# Patient Record
Sex: Male | Born: 1990
Health system: Southern US, Community
[De-identification: ages and names within clinical notes are randomized; demographics above are authoritative.]

## PROBLEM LIST (undated history)

## (undated) DIAGNOSIS — Z87828 Personal history of other (healed) physical injury and trauma: Secondary | ICD-10-CM

## (undated) DIAGNOSIS — J45909 Unspecified asthma, uncomplicated: Secondary | ICD-10-CM

## (undated) DIAGNOSIS — T7840XA Allergy, unspecified, initial encounter: Secondary | ICD-10-CM

## (undated) HISTORY — DX: Allergy, unspecified, initial encounter: T78.40XA

## (undated) HISTORY — DX: Unspecified asthma, uncomplicated: J45.909

## (undated) HISTORY — DX: Personal history of other (healed) physical injury and trauma: Z87.828

---
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
## 2015-12-10 ENCOUNTER — Encounter: Payer: Self-pay | Admitting: Sports Medicine

## 2015-12-10 DIAGNOSIS — M79671 Pain in right foot: Secondary | ICD-10-CM

## 2015-12-10 DIAGNOSIS — R609 Edema, unspecified: Secondary | ICD-10-CM

## 2015-12-10 DIAGNOSIS — S8391XA Sprain of unspecified site of right knee, initial encounter: Secondary | ICD-10-CM

## 2015-12-10 NOTE — Patient Instructions (Signed)

## 2015-12-10 NOTE — Progress Notes (Signed)
Subjective: Ian Spencer is a 25 y.o. male patient who presents to office for evaluation of right foot and knee pain. Patient states that he took a mis-step and had pain in foot that's now resolved but continued pain the in the knee. Patient denies any other pedal complaints.  Admits to significant history of right knee torn meniscus, partial PCL tear and ACL repair.   There are no active problems to display for this patient.   No current outpatient prescriptions on file prior to visit.   No current facility-administered medications on file prior to visit.     Allergies not on file  Objective:  General: Alert and oriented x3 in no acute distress  Dermatology: No open lesions bilateral lower extremities, no webspace macerations, no ecchymosis bilateral, all nails x 10 are well manicured.  Vascular: Dorsalis Pedis and Posterior Tibial pedal pulses palpable, Capillary Fill Time 3 seconds,(+) pedal hair growth bilateral, focal edema right knee, femoral and popliteal artery intact, Temperature gradient within normal limits.  Neurology: Ian Spencer sensation intact via light touch bilateral, Protective sensation intact  with Semmes Weinstein Monofilament to all pedal sites, Position sense intact, vibratory intact bilateral, Deep tendon reflexes within normal limits bilateral, No babinski sign present bilateral. (- )Tinels sign bilateral.   Musculoskeletal: Significant tenderness to right knee, No tenderness with palpation at right foot, no ankle instability, No pain with calf compression bilateral. There is decreased ankle rom with knee extending  vs flexed resembling gastroc equnius bilateral, Subtalar joint range of motion is within normal limits, there is no 1st ray hypermobility noted bilateral, decreased 1st MPJ rom Right>Left with functional limitus noted on weightbearing exam. Strength within normal limits in all groups bilateral.   Gait: Antalgic gait  Assessment and Plan: Problem List Items  Addressed This Visit    None    Visit Diagnoses    Knee sprain and strain, right, initial encounter    -  Primary   Swelling       Foot pain, right       Resolved      -Complete examination performed -Recommend good supportive shoes for stability in gait and use of right knee brace, rest, ice twice daily, elevation -Recommend Motrin 3-4x daily -Recommend no work to rest knee for 3-4 weeks until symptoms are resolved if fail to improve would recommend Ortho follow up -Patient to return to office as needed or sooner if condition worsens.  Ian Spencer, DPM

## 2016-01-03 ENCOUNTER — Encounter: Payer: Self-pay | Admitting: Sports Medicine

## 2016-01-03 NOTE — Progress Notes (Signed)
Subjective: Ian Spencer is a 25 y.o. male patient who returns to office for follow up evaluation of right foot and knee pain. Patient has been out of work since 12/10/15 after he took a  mis-step and had pain in foot that's now resolved but continued pain the in the knee. Patient reports rest and bracing has helped. But still has swelling at knee. Patient denies any other pedal complaints.  There are no active problems to display for this patient.   No current outpatient prescriptions on file prior to visit.   No current facility-administered medications on file prior to visit.     Not on File  Objective:  General: Alert and oriented x3 in no acute distress  Dermatology: No open lesions bilateral lower extremities, no webspace macerations, no ecchymosis bilateral, all nails x 10 are well manicured.  Vascular: Dorsalis Pedis and Posterior Tibial pedal pulses palpable, Capillary Fill Time 3 seconds,(+) pedal hair growth bilateral, focal edema right knee, femoral and popliteal artery intact, Temperature gradient within normal limits.  Neurology: [name] sensation intact via light touch bilateral, Protective sensation intact  with Semmes Weinstein Monofilament to all pedal sites, Position sense intact, vibratory intact bilateral, Deep tendon reflexes within normal limits bilateral, No babinski sign present bilateral. (- )Tinels sign bilateral.   Musculoskeletal: Moderate edema with decreased tenderness to right knee, No tenderness with palpation at right foot, no ankle instability, No pain with calf compression bilateral. There is decreased ankle rom with knee extending  vs flexed resembling gastroc equnius bilateral, Subtalar joint range of motion is within normal limits, there is no 1st ray hypermobility noted bilateral, decreased 1st MPJ rom Right>Left with functional limitus noted on weightbearing exam. Strength within normal limits in all groups bilateral.   Gait: Mildly Antalgic  gait  Assessment and Plan: Right knee sprain Right knee swelling History of previous Right knee injury Right foot pain, resolved  -Complete examination performed -Recommend good supportive shoes for stability in gait and use of right knee brace, rest, ice twice daily, and elevation -Changed motrin to meloxicam to assist with decreasing edema at right knee -Recommend no work to rest knee and to prevent medication side effects of drowsiness that may interfere with duties for 3 weeks (recommend patient to return to work on 01-28-16 with no restrictions).  If knee fails to continue improve would recommend Ortho follow up -Patient to return to office as needed or sooner if condition worsens.  Asencion Islamitorya Daryn Pisani, DPM

## 2016-02-01 ENCOUNTER — Encounter: Payer: Self-pay | Admitting: Sports Medicine

## 2016-02-01 NOTE — Progress Notes (Signed)
Patient presented to office for disability paperwork. Return to duty form completed for 02-11-16 with no restrictions.  -Dr. Marylene LandStover

## 2019-02-16 DIAGNOSIS — L700 Acne vulgaris: Secondary | ICD-10-CM | POA: Diagnosis not present

## 2019-02-16 DIAGNOSIS — L858 Other specified epidermal thickening: Secondary | ICD-10-CM | POA: Diagnosis not present

## 2019-02-16 DIAGNOSIS — L218 Other seborrheic dermatitis: Secondary | ICD-10-CM | POA: Diagnosis not present

## 2019-03-29 DIAGNOSIS — M25561 Pain in right knee: Secondary | ICD-10-CM | POA: Diagnosis not present

## 2019-04-01 DIAGNOSIS — Z136 Encounter for screening for cardiovascular disorders: Secondary | ICD-10-CM | POA: Diagnosis not present

## 2019-04-01 DIAGNOSIS — Z713 Dietary counseling and surveillance: Secondary | ICD-10-CM | POA: Diagnosis not present

## 2019-04-01 DIAGNOSIS — Z13220 Encounter for screening for lipoid disorders: Secondary | ICD-10-CM | POA: Diagnosis not present

## 2019-04-01 DIAGNOSIS — Z6825 Body mass index (BMI) 25.0-25.9, adult: Secondary | ICD-10-CM | POA: Diagnosis not present

## 2019-04-05 DIAGNOSIS — Z20828 Contact with and (suspected) exposure to other viral communicable diseases: Secondary | ICD-10-CM | POA: Diagnosis not present

## 2019-04-11 DIAGNOSIS — M25561 Pain in right knee: Secondary | ICD-10-CM | POA: Diagnosis not present

## 2019-05-03 DIAGNOSIS — Z20828 Contact with and (suspected) exposure to other viral communicable diseases: Secondary | ICD-10-CM | POA: Diagnosis not present

## 2019-09-01 DIAGNOSIS — Z03818 Encounter for observation for suspected exposure to other biological agents ruled out: Secondary | ICD-10-CM | POA: Diagnosis not present

## 2019-10-17 DIAGNOSIS — L700 Acne vulgaris: Secondary | ICD-10-CM | POA: Diagnosis not present

## 2019-10-17 DIAGNOSIS — B360 Pityriasis versicolor: Secondary | ICD-10-CM | POA: Diagnosis not present

## 2019-10-17 DIAGNOSIS — L2084 Intrinsic (allergic) eczema: Secondary | ICD-10-CM | POA: Diagnosis not present

## 2019-12-08 DIAGNOSIS — Z20822 Contact with and (suspected) exposure to covid-19: Secondary | ICD-10-CM | POA: Diagnosis not present

## 2019-12-26 DIAGNOSIS — Z20822 Contact with and (suspected) exposure to covid-19: Secondary | ICD-10-CM | POA: Diagnosis not present

## 2020-01-09 DIAGNOSIS — Z20822 Contact with and (suspected) exposure to covid-19: Secondary | ICD-10-CM | POA: Diagnosis not present

## 2020-02-14 ENCOUNTER — Other Ambulatory Visit: Payer: Self-pay

## 2020-02-14 DIAGNOSIS — F411 Generalized anxiety disorder: Secondary | ICD-10-CM | POA: Diagnosis not present

## 2020-02-14 DIAGNOSIS — F321 Major depressive disorder, single episode, moderate: Secondary | ICD-10-CM | POA: Diagnosis not present

## 2020-02-15 ENCOUNTER — Other Ambulatory Visit: Payer: Self-pay

## 2020-02-15 ENCOUNTER — Ambulatory Visit (INDEPENDENT_AMBULATORY_CARE_PROVIDER_SITE_OTHER): Payer: BC Managed Care – PPO | Admitting: Nurse Practitioner

## 2020-02-15 ENCOUNTER — Encounter: Payer: Self-pay | Admitting: Nurse Practitioner

## 2020-02-15 VITALS — BP 110/74 | HR 70 | Temp 97.7°F | Ht 72.25 in | Wt 203.6 lb

## 2020-02-15 DIAGNOSIS — F329 Major depressive disorder, single episode, unspecified: Secondary | ICD-10-CM

## 2020-02-15 DIAGNOSIS — F339 Major depressive disorder, recurrent, unspecified: Secondary | ICD-10-CM | POA: Insufficient documentation

## 2020-02-15 DIAGNOSIS — F32A Depression, unspecified: Secondary | ICD-10-CM

## 2020-02-15 DIAGNOSIS — Z13220 Encounter for screening for lipoid disorders: Secondary | ICD-10-CM

## 2020-02-15 DIAGNOSIS — Z136 Encounter for screening for cardiovascular disorders: Secondary | ICD-10-CM | POA: Diagnosis not present

## 2020-02-15 DIAGNOSIS — F411 Generalized anxiety disorder: Secondary | ICD-10-CM | POA: Insufficient documentation

## 2020-02-15 DIAGNOSIS — Z0000 Encounter for general adult medical examination without abnormal findings: Secondary | ICD-10-CM | POA: Diagnosis not present

## 2020-02-15 NOTE — Progress Notes (Signed)
Subjective:    Patient ID: Ian Spencer, male    DOB: 07/21/1990, 29 y.o.   MRN: [id number]  Patient presents today for CPE and eval of chronic condition (anxiety and depression  HPI Depressive disorder Chronic, worse in last 51months. Unable to identify triggering factor. reports thoughts of death but no plans, no hallucination, no hx of suicide attempt. No known FHx of mental health disorder, no FHx of suicide. No ETOH use, no tobacco use, no illicit use. occassional caffeine intake. Start therapy sessions 14month ago with minimal improvement. Also evaluated by behavioral health provider. These services are through [facility]. He is requesting referral to psychiatry. He was STD forms completed. He has put therapy sessions on hold till STD forms are completed.  I advised to resumed therapy sessions, but he declined. I provided a list of psychiatrist in the area. He is to verify which is within his network and call office for referral.  Sexual History (orientation,birth control, marital status, STD):married, sexually active, denies need for STD screen  Depression/Suicide:has ongoing sessions with therapy, wants referral to psychiatry, reports thoughts of death but no plans, no hallucination, no hx of suicide attempt. Depression screen PHQ 2/9 02/15/2020  Decreased Interest 3  Down, Depressed, Hopeless 3  PHQ - 2 Score 6  Altered sleeping 3  Tired, decreased energy 3  Change in appetite 3  Trouble concentrating 3  Moving slowly or fidgety/restless 3  Suicidal thoughts 2  PHQ-9 Score 23  Difficult doing work/chores Extremely dIfficult   GAD 7 : Generalized Anxiety Score 02/15/2020  Nervous, Anxious, on Edge 3  Control/stop worrying 3  Worry too much - different things 3  Trouble relaxing 3  Restless 3  Easily annoyed or irritable 3  Afraid - awful might happen 3  Total GAD 7 Score 21  Anxiety Difficulty Extremely difficult   Vision:will schedule  Dental:will  schedule  Immunizations: (TDAP, Hep C screen, Pneumovax, Influenza, zoster)  Health Maintenance  Topic Date Due  . Flu Shot  07/26/2020*  . Tetanus Vaccine  02/14/2021*  .  Hepatitis C: One time screening is recommended by Center for Disease Control  (CDC) for  adults born from [birth year] through 1965.   02/14/2021*  . HIV Screening  02/14/2021*  *Topic was postponed. The date shown is not the original due date.   Diet:regular.  Weight:  Wt Readings from Last 3 Encounters:  02/15/20 203 lb 9.6 oz (92.4 kg)   Medications and allergies reviewed with patient and updated if appropriate.  Patient Active Problem List   Diagnosis Date Noted  . Depressive disorder 02/15/2020    No current outpatient medications on file prior to visit.   No current facility-administered medications on file prior to visit.    Past Medical History:  Diagnosis Date  . Allergy   . Asthma   . H/O sprain of knee     Past Surgical History:  Procedure Laterality Date  . KNEE ARTHROSCOPY WITH ANTERIOR CRUCIATE LIGAMENT (ACL) REPAIR Right 2010    Social History   Socioeconomic History  . Marital status: Married    Spouse name: Not on file  . Number of children: Not on file  . Years of education: Not on file  . Highest education level: Not on file  Occupational History  . Not on file  Tobacco Use  . Smoking status: Never Smoker  . Smokeless tobacco: Never Used  Vaping Use  . Vaping Use: Never used  Substance and Sexual Activity  .  Alcohol use: Yes    Comment: 3x year  . Drug use: Never  . Sexual activity: Yes    Birth control/protection: None  Other Topics Concern  . Not on file  Social History Narrative  . Not on file   Social Determinants of Health   Financial Resource Strain:   . Difficulty of Paying Living Expenses: Not on file  Food Insecurity:   . Worried About Programme researcher, broadcasting/film/video in the Last Year: Not on file  . Ran Out of Food in the Last Year: Not on file  Transportation  Needs:   . Lack of Transportation (Medical): Not on file  . Lack of Transportation (Non-Medical): Not on file  Physical Activity:   . Days of Exercise per Week: Not on file  . Minutes of Exercise per Session: Not on file  Stress:   . Feeling of Stress : Not on file  Social Connections:   . Frequency of Communication with Friends and Family: Not on file  . Frequency of Social Gatherings with Friends and Family: Not on file  . Attends Religious Services: Not on file  . Active Member of Clubs or Organizations: Not on file  . Attends Banker Meetings: Not on file  . Marital Status: Not on file    Family History  Problem Relation Age of Onset  . Cancer Maternal Grandfather 60       colon cancer        ROS  Objective:   Vitals:   02/15/20 1142  BP: 110/74  Pulse: 70  Temp: 97.7 F (36.5 C)  SpO2: 99%    Body mass index is 27.42 kg/m.   Physical Examination:  Physical Exam Vitals and nursing note reviewed. Exam conducted with a chaperone present.  Constitutional:      General: He is not in acute distress.    Appearance: He is well-developed.  HENT:     Right Ear: Tympanic membrane, ear canal and external ear normal.     Left Ear: Tympanic membrane, ear canal and external ear normal.  Eyes:     Extraocular Movements: Extraocular movements intact.     Conjunctiva/sclera: Conjunctivae normal.  Cardiovascular:     Rate and Rhythm: Normal rate and regular rhythm.     Pulses: Normal pulses.     Heart sounds: Normal heart sounds.  Pulmonary:     Effort: Pulmonary effort is normal. No respiratory distress.     Breath sounds: Normal breath sounds.  Chest:     Chest wall: No tenderness.  Abdominal:     General: Bowel sounds are normal.     Palpations: Abdomen is soft.  Genitourinary:    Testes: Normal.  Musculoskeletal:        General: Normal range of motion.     Cervical back: Normal range of motion and neck supple.     Right lower leg: No edema.      Left lower leg: No edema.  Skin:    General: Skin is warm and dry.  Neurological:     Mental Status: He is alert and oriented to person, place, and time.     Deep Tendon Reflexes: Reflexes are normal and symmetric.  Psychiatric:        Mood and Affect: Mood normal.        Behavior: Behavior normal.        Thought Content: Thought content normal.    ASSESSMENT and PLAN: This visit occurred during the SARS-CoV-2 public health  emergency.  Safety protocols were in place, including screening questions prior to the visit, additional usage of staff PPE, and extensive cleaning of exam room while observing appropriate contact time as indicated for disinfecting solutions.   Chibueze was seen today for establish care.  Diagnoses and all orders for this visit:  Preventative health care -     CBC with Differential/Platelet; Future -     Comprehensive metabolic panel; Future -     Lipid panel; Future -     TSH; Future  Encounter for lipid screening for cardiovascular disease -     Lipid panel; Future  Depressive disorder      Problem List Items Addressed This Visit      Other   Depressive disorder    Chronic, worse in last 67months. Unable to identify triggering factor. reports thoughts of death but no plans, no hallucination, no hx of suicide attempt. No known FHx of mental health disorder, no FHx of suicide. No ETOH use, no tobacco use, no illicit use. occassional caffeine intake. Start therapy sessions 69month ago with minimal improvement. Also evaluated by behavioral health provider. These services are through [facility]. He is requesting referral to psychiatry. He was STD forms completed. He has put therapy sessions on hold till STD forms are completed.  I advised to resumed therapy sessions, but he declined. I provided a list of psychiatrist in the area. He is to verify which is within his network and call office for referral.       Other Visit Diagnoses    Preventative health  care    -  Primary   Relevant Orders   CBC with Differential/Platelet   Comprehensive metabolic panel   Lipid panel   TSH   Encounter for lipid screening for cardiovascular disease       Relevant Orders   Lipid panel      Follow up: Return in about 1 year (around 02/14/2021) for CPE (fasting).  Alysia Penna, NP

## 2020-02-15 NOTE — Patient Instructions (Addendum)
[facility] [place] Let me know which of these are within your insurance network.  Schedule lab appt, need to be fasting at least 6-8hrs prior blood draw.   Preventive Care 34-29 Years Old, Male Preventive care refers to lifestyle choices and visits with your health care provider that can promote health and wellness. This includes:  A yearly physical exam. This is also called an annual well check.  Regular dental and eye exams.  Immunizations.  Screening for certain conditions.  Healthy lifestyle choices, such as eating a healthy diet, getting regular exercise, not using drugs or products that contain nicotine and tobacco, and limiting alcohol use. What can I expect for my preventive care visit? Physical exam Your health care provider will check:  Height and weight. These may be used to calculate body mass index (BMI), which is a measurement that tells if you are at a healthy weight.  Heart rate and blood pressure.  Your skin for abnormal spots. Counseling Your health care provider may ask you questions about:  Alcohol, tobacco, and drug use.  Emotional well-being.  Home and relationship well-being.  Sexual activity.  Eating habits.  Work and work Statistician. What immunizations do I need?  Influenza (flu) vaccine  This is recommended every year. Tetanus, diphtheria, and pertussis (Tdap) vaccine  You may need a Td booster every 10 years. Varicella (chickenpox) vaccine  You may need this vaccine if you have not already been vaccinated. Human papillomavirus (HPV) vaccine  If recommended by your health care provider, you may need three doses over 6 months. Measles, mumps, and rubella (MMR) vaccine  You may need at least one dose of MMR. You may also need a second dose. Meningococcal conjugate (MenACWY) vaccine  One dose is recommended if you are 29-45 years old and a [place] living in a residence hall, or if you have one of several medical conditions. You may also need additional booster doses. Pneumococcal conjugate (PCV13) vaccine  You may need this if you have certain conditions and were not previously vaccinated. Pneumococcal polysaccharide (PPSV23) vaccine  You may need one or two doses if you smoke cigarettes or if you have certain conditions. Hepatitis A vaccine  You may need this if you have certain conditions or if you travel or work in places where you may be exposed to hepatitis A. Hepatitis B vaccine  You may need this if you have certain conditions or if you travel or work in places where you may be exposed to hepatitis B. Haemophilus influenzae type b (Hib) vaccine  You may need this if you have certain risk factors. You may receive vaccines as individual doses or as more than one vaccine together in one shot (combination vaccines). Talk with your health care provider about the risks and benefits of combination vaccines. What tests do I need? Blood tests  Lipid and cholesterol levels. These may be checked every 5 years starting at age 25.  Hepatitis C test.  Hepatitis B test. Screening   Diabetes screening. This is done by checking your blood sugar (glucose) after you have not eaten for a while (fasting).  Sexually transmitted disease (STD) testing. Talk with your health care provider about your test results, treatment options, and if necessary, the need for more tests. Follow these instructions at home: Eating and drinking   Eat a diet that includes fresh fruits and vegetables, whole grains, lean protein, and low-fat dairy products.  Take vitamin and mineral  supplements as recommended by your health care provider.  Do not drink alcohol if your health care provider tells you not to drink.  If you drink alcohol: ? Limit how much you have to 0-2 drinks a day. ? Be aware of how much alcohol is in your drink. In the [place] one  drink equals one 12 oz bottle of beer (355 mL), one 5 oz glass of wine (148 mL), or one 1 oz glass of hard liquor (44 mL). Lifestyle  Take daily care of your teeth and gums.  Stay active. Exercise for at least 30 minutes on 5 or more days each week.  Do not use any products that contain nicotine or tobacco, such as cigarettes, e-cigarettes, and chewing tobacco. If you need help quitting, ask your health care provider.  If you are sexually active, practice safe sex. Use a condom or other form of protection to prevent STIs (sexually transmitted infections). What's next?  Go to your health care provider once a year for a well check visit.  Ask your health care provider how often you should have your eyes and teeth checked.  Stay up to date on all vaccines. This information is not intended to replace advice given to you by your health care provider. Make sure you discuss any questions you have with your health care provider. Document Revised: 04/08/2018 Document Reviewed: 04/08/2018 Elsevier Patient Education  2020 [employer].

## 2020-02-15 NOTE — Assessment & Plan Note (Addendum)
Chronic, worse in last 47months. Unable to identify triggering factor. reports thoughts of death but no plans, no hallucination, no hx of suicide attempt. No known FHx of mental health disorder, no FHx of suicide. No ETOH use, no tobacco use, no illicit use. occassional caffeine intake. Start therapy sessions 64month ago with minimal improvement. Also evaluated by behavioral health provider. These services are through [facility]. He is requesting referral to psychiatry. He was STD forms completed. He has put therapy sessions on hold till STD forms are completed.  I advised to resumed therapy sessions, but he declined. I provided a list of psychiatrist in the area. He is to verify which is within his network and call office for referral.

## 2020-02-16 ENCOUNTER — Other Ambulatory Visit (INDEPENDENT_AMBULATORY_CARE_PROVIDER_SITE_OTHER): Payer: BC Managed Care – PPO

## 2020-02-16 DIAGNOSIS — Z136 Encounter for screening for cardiovascular disorders: Secondary | ICD-10-CM

## 2020-02-16 DIAGNOSIS — Z13220 Encounter for screening for lipoid disorders: Secondary | ICD-10-CM | POA: Diagnosis not present

## 2020-02-16 DIAGNOSIS — Z0000 Encounter for general adult medical examination without abnormal findings: Secondary | ICD-10-CM | POA: Diagnosis not present

## 2020-02-16 LAB — CBC WITH DIFFERENTIAL/PLATELET
Basophils Absolute: 0 10*3/uL (ref 0.0–0.1)
Basophils Relative: 0.6 % (ref 0.0–3.0)
Eosinophils Absolute: 0.1 10*3/uL (ref 0.0–0.7)
Eosinophils Relative: 3.1 % (ref 0.0–5.0)
HCT: 43.6 % (ref 39.0–52.0)
Hemoglobin: 14.6 g/dL (ref 13.0–17.0)
Lymphocytes Relative: 39.6 % (ref 12.0–46.0)
Lymphs Abs: 1.8 10*3/uL (ref 0.7–4.0)
MCHC: 33.5 g/dL (ref 30.0–36.0)
MCV: 86.4 fl (ref 78.0–100.0)
Monocytes Absolute: 0.6 10*3/uL (ref 0.1–1.0)
Monocytes Relative: 12 % (ref 3.0–12.0)
Neutro Abs: 2.1 10*3/uL (ref 1.4–7.7)
Neutrophils Relative %: 44.7 % (ref 43.0–77.0)
Platelets: 164 10*3/uL (ref 150.0–400.0)
RBC: 5.05 Mil/uL (ref 4.22–5.81)
RDW: 13.2 % (ref 11.5–15.5)
WBC: 4.7 10*3/uL (ref 4.0–10.5)

## 2020-02-16 LAB — LIPID PANEL
Cholesterol: 171 mg/dL (ref 0–200)
HDL: 46.2 mg/dL (ref >39.00)
LDL Cholesterol: 110 mg/dL — ABNORMAL HIGH (ref 0–99)
NonHDL: 124.62
Total CHOL/HDL Ratio: 4
Triglycerides: 73 mg/dL (ref 0.0–149.0)
VLDL: 14.6 mg/dL (ref 0.0–40.0)

## 2020-02-16 LAB — TSH: TSH: 1.82 u[IU]/mL (ref 0.35–4.50)

## 2020-02-17 ENCOUNTER — Ambulatory Visit: Payer: Self-pay | Admitting: Physician Assistant

## 2020-02-17 DIAGNOSIS — Z713 Dietary counseling and surveillance: Secondary | ICD-10-CM | POA: Diagnosis not present

## 2020-02-17 DIAGNOSIS — Z13220 Encounter for screening for lipoid disorders: Secondary | ICD-10-CM | POA: Diagnosis not present

## 2020-02-17 DIAGNOSIS — Z6826 Body mass index (BMI) 26.0-26.9, adult: Secondary | ICD-10-CM | POA: Diagnosis not present

## 2020-02-17 DIAGNOSIS — Z136 Encounter for screening for cardiovascular disorders: Secondary | ICD-10-CM | POA: Diagnosis not present

## 2020-02-17 LAB — COMPREHENSIVE METABOLIC PANEL
ALT: 22 U/L (ref 0–53)
AST: 21 U/L (ref 0–37)
Albumin: 4.5 g/dL (ref 3.5–5.2)
Alkaline Phosphatase: 51 U/L (ref 39–117)
BUN: 14 mg/dL (ref 6–23)
CO2: 30 mEq/L (ref 19–32)
Calcium: 9.8 mg/dL (ref 8.4–10.5)
Chloride: 103 mEq/L (ref 96–112)
Creatinine, Ser: 1.19 mg/dL (ref 0.40–1.50)
GFR: 85 mL/min (ref >60.00)
Glucose, Bld: 96 mg/dL (ref 70–99)
Potassium: 4.3 mEq/L (ref 3.5–5.1)
Sodium: 138 mEq/L (ref 135–145)
Total Bilirubin: 1.8 mg/dL — ABNORMAL HIGH (ref 0.2–1.2)
Total Protein: 7.5 g/dL (ref 6.0–8.3)

## 2020-02-28 DIAGNOSIS — F321 Major depressive disorder, single episode, moderate: Secondary | ICD-10-CM | POA: Diagnosis not present

## 2020-02-28 DIAGNOSIS — F411 Generalized anxiety disorder: Secondary | ICD-10-CM | POA: Diagnosis not present

## 2020-03-06 DIAGNOSIS — F321 Major depressive disorder, single episode, moderate: Secondary | ICD-10-CM | POA: Diagnosis not present

## 2020-03-06 DIAGNOSIS — F411 Generalized anxiety disorder: Secondary | ICD-10-CM | POA: Diagnosis not present

## 2020-03-15 ENCOUNTER — Emergency Department (HOSPITAL_COMMUNITY): Payer: BC Managed Care – PPO

## 2020-03-15 ENCOUNTER — Other Ambulatory Visit: Payer: Self-pay

## 2020-03-15 ENCOUNTER — Emergency Department (HOSPITAL_COMMUNITY)
Admission: EM | Admit: 2020-03-15 | Discharge: 2020-03-15 | Disposition: A | Discharge status: Home / Self Care | Payer: BC Managed Care – PPO | Attending: Emergency Medicine | Admitting: Emergency Medicine

## 2020-03-15 ENCOUNTER — Encounter (HOSPITAL_COMMUNITY): Payer: Self-pay | Admitting: Emergency Medicine

## 2020-03-15 DIAGNOSIS — S3991XA Unspecified injury of abdomen, initial encounter: Secondary | ICD-10-CM | POA: Diagnosis not present

## 2020-03-15 DIAGNOSIS — Y92410 Unspecified street and highway as the place of occurrence of the external cause: Secondary | ICD-10-CM | POA: Diagnosis not present

## 2020-03-15 DIAGNOSIS — M545 Low back pain, unspecified: Secondary | ICD-10-CM | POA: Insufficient documentation

## 2020-03-15 DIAGNOSIS — S3992XA Unspecified injury of lower back, initial encounter: Secondary | ICD-10-CM | POA: Diagnosis not present

## 2020-03-15 DIAGNOSIS — R1084 Generalized abdominal pain: Secondary | ICD-10-CM

## 2020-03-15 DIAGNOSIS — M5117 Intervertebral disc disorders with radiculopathy, lumbosacral region: Secondary | ICD-10-CM | POA: Diagnosis not present

## 2020-03-15 DIAGNOSIS — R1030 Lower abdominal pain, unspecified: Secondary | ICD-10-CM | POA: Insufficient documentation

## 2020-03-15 DIAGNOSIS — S199XXA Unspecified injury of neck, initial encounter: Secondary | ICD-10-CM | POA: Diagnosis not present

## 2020-03-15 DIAGNOSIS — M4727 Other spondylosis with radiculopathy, lumbosacral region: Secondary | ICD-10-CM | POA: Diagnosis not present

## 2020-03-15 DIAGNOSIS — Y9389 Activity, other specified: Secondary | ICD-10-CM | POA: Insufficient documentation

## 2020-03-15 DIAGNOSIS — Z041 Encounter for examination and observation following transport accident: Secondary | ICD-10-CM | POA: Diagnosis not present

## 2020-03-15 DIAGNOSIS — M549 Dorsalgia, unspecified: Secondary | ICD-10-CM

## 2020-03-15 DIAGNOSIS — Q7649 Other congenital malformations of spine, not associated with scoliosis: Secondary | ICD-10-CM | POA: Diagnosis not present

## 2020-03-15 DIAGNOSIS — R202 Paresthesia of skin: Secondary | ICD-10-CM | POA: Insufficient documentation

## 2020-03-15 DIAGNOSIS — R200 Anesthesia of skin: Secondary | ICD-10-CM

## 2020-03-15 DIAGNOSIS — M542 Cervicalgia: Secondary | ICD-10-CM | POA: Diagnosis not present

## 2020-03-15 DIAGNOSIS — S299XXA Unspecified injury of thorax, initial encounter: Secondary | ICD-10-CM | POA: Diagnosis not present

## 2020-03-15 DIAGNOSIS — J45909 Unspecified asthma, uncomplicated: Secondary | ICD-10-CM | POA: Diagnosis not present

## 2020-03-15 DIAGNOSIS — M25462 Effusion, left knee: Secondary | ICD-10-CM | POA: Diagnosis not present

## 2020-03-15 LAB — CBC
HCT: 45.2 % (ref 39.0–52.0)
Hemoglobin: 14.9 g/dL (ref 13.0–17.0)
MCH: 28.6 pg (ref 26.0–34.0)
MCHC: 33 g/dL (ref 30.0–36.0)
MCV: 86.8 fL (ref 80.0–100.0)
Platelets: 189 10*3/uL (ref 150–400)
RBC: 5.21 MIL/uL (ref 4.22–5.81)
RDW: 12.6 % (ref 11.5–15.5)
WBC: 8.3 10*3/uL (ref 4.0–10.5)
nRBC: 0 % (ref 0.0–0.2)

## 2020-03-15 LAB — COMPREHENSIVE METABOLIC PANEL
ALT: 32 U/L (ref 0–44)
AST: 30 U/L (ref 15–41)
Albumin: 4.2 g/dL (ref 3.5–5.0)
Alkaline Phosphatase: 49 U/L (ref 38–126)
Anion gap: 9 (ref 5–15)
BUN: 14 mg/dL (ref 6–20)
CO2: 27 mmol/L (ref 22–32)
Calcium: 9.6 mg/dL (ref 8.9–10.3)
Chloride: 102 mmol/L (ref 98–111)
Creatinine, Ser: 1.33 mg/dL — ABNORMAL HIGH (ref 0.61–1.24)
GFR, Estimated: >60 mL/min (ref >60)
Glucose, Bld: 99 mg/dL (ref 70–99)
Potassium: 3.7 mmol/L (ref 3.5–5.1)
Sodium: 138 mmol/L (ref 135–145)
Total Bilirubin: 1.6 mg/dL — ABNORMAL HIGH (ref 0.3–1.2)
Total Protein: 7.9 g/dL (ref 6.5–8.1)

## 2020-03-15 LAB — LACTIC ACID, PLASMA: Lactic Acid, Venous: 1.3 mmol/L (ref 0.5–1.9)

## 2020-03-15 LAB — LIPASE, BLOOD: Lipase: 38 U/L (ref 11–51)

## 2020-03-15 IMAGING — MR MR THORACIC SPINE W/O CM
4 of 7 series · 17 of 48 positions shown · non-contrast
Comparison: Prior CT from earlier the same day.

CLINICAL DATA: Initial evaluation for acute trauma.

EXAM:
MRI THORACIC AND LUMBAR SPINE WITHOUT CONTRAST
TECHNIQUE: Multiplanar and multiecho pulse sequences of the thoracic and lumbar
spine were obtained without intravenous contrast.

[Series 2: T1 · sagittal · 3.0mm · 0.90mm/px · 3 of 16 slices shown (1 of 2)]
[im 1/16]
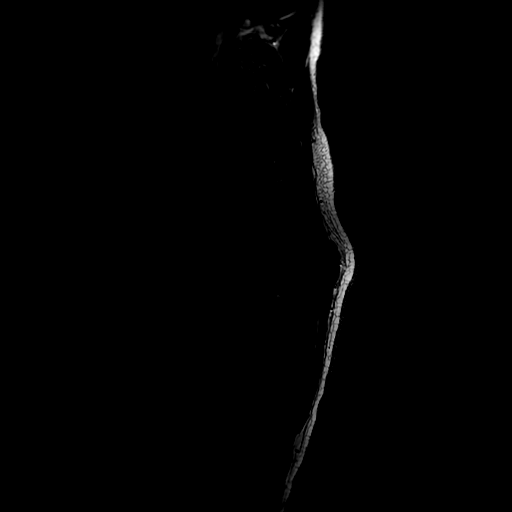
[im 8/16]
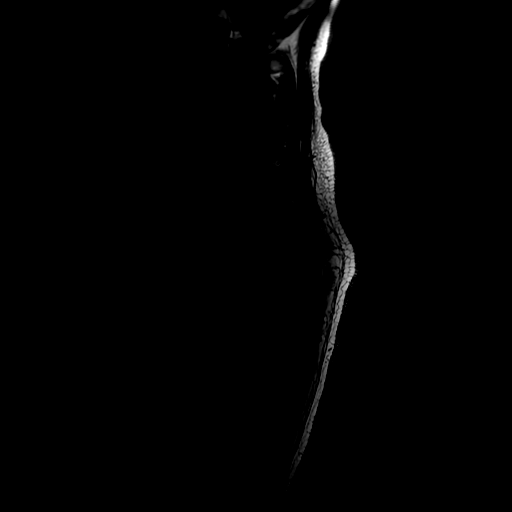
[im 16/16]
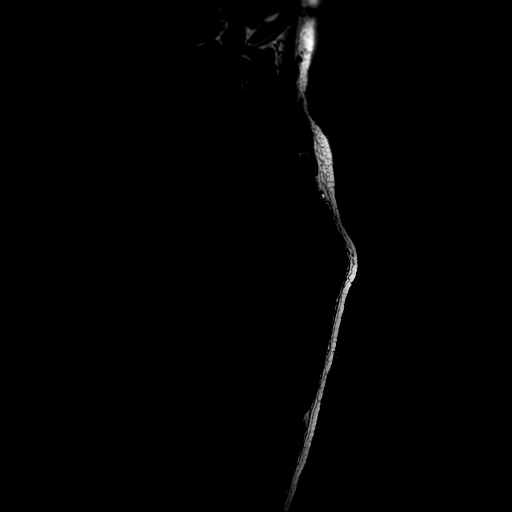

[Series 4: T2 · sagittal · 3.0mm · 0.66mm/px · 3 of 15 slices shown (1 of 2)]
[im 1/15]
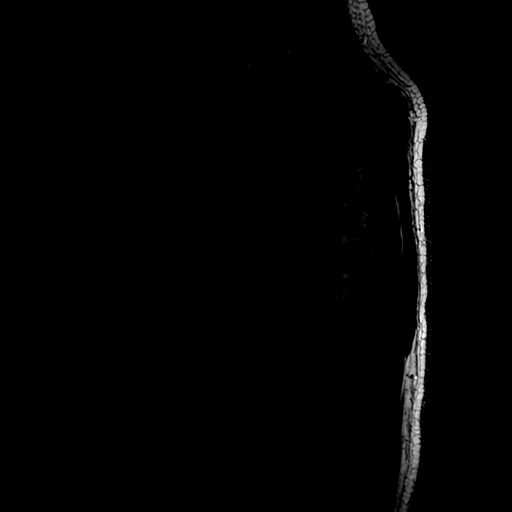
[im 8/15]
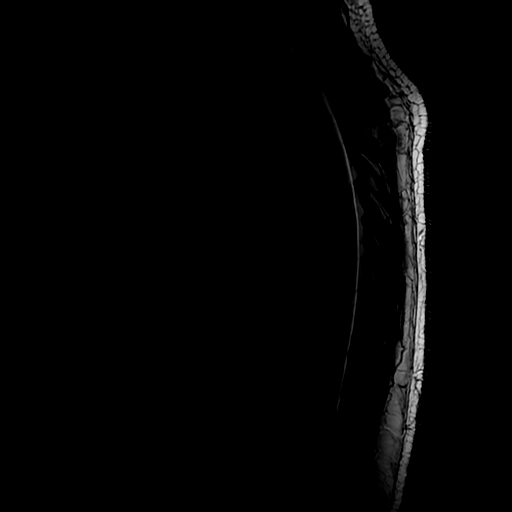
[im 15/15]
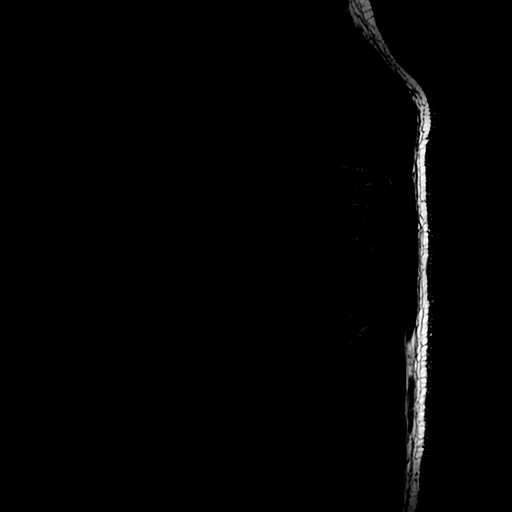

[Series 6: T1 · sagittal · 3.0mm · 0.66mm/px · 3 of 15 slices shown (2 of 2)]
[im 1/15]
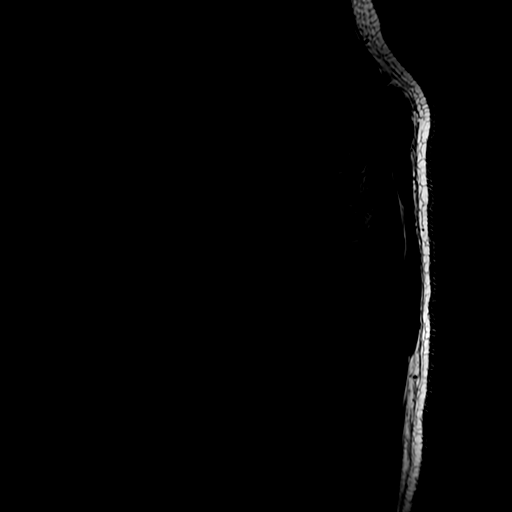
[im 8/15]
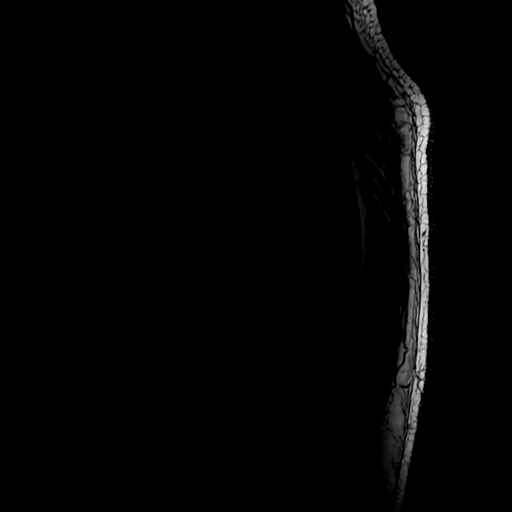
[im 15/15]
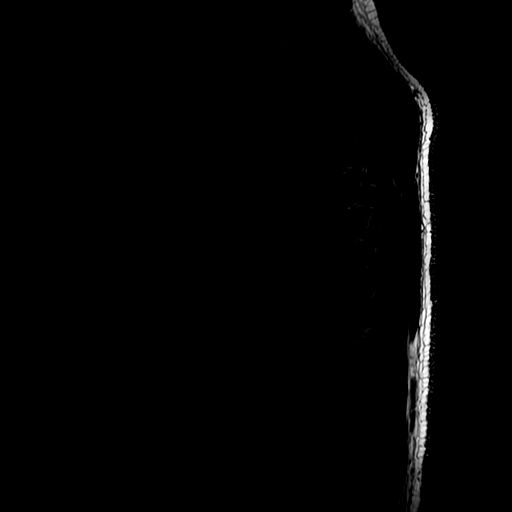

[Series 7: T2 · axial · 4.0mm · 0.39mm/px · z∈[-404,-134]mm · 8 of 55 slices shown (2 of 2)]
[im 1/55]
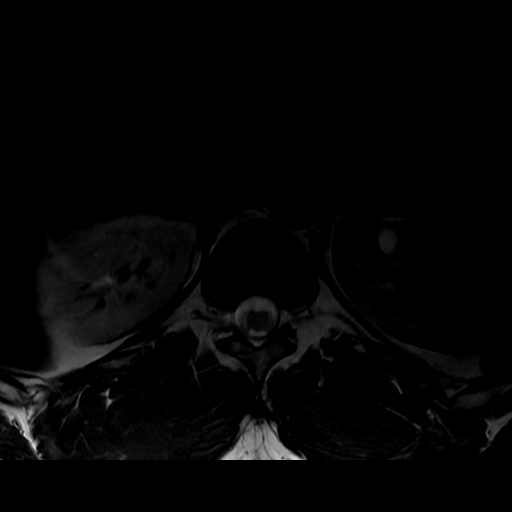
[im 10/55]
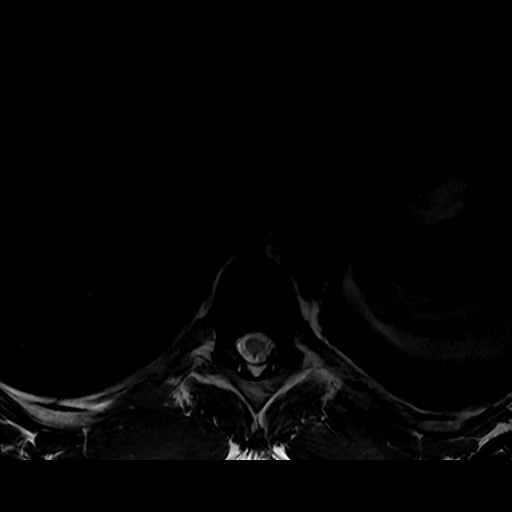
[im 15/55]
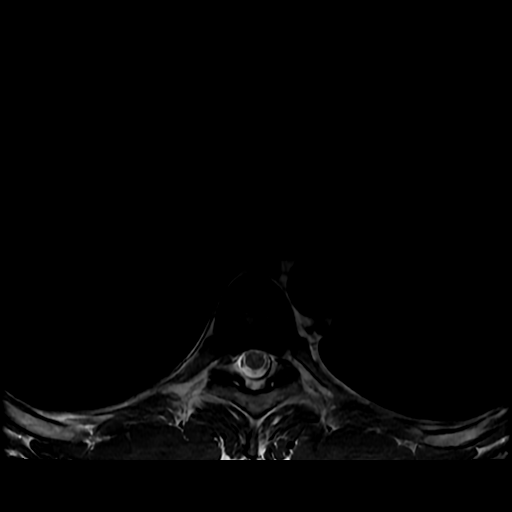
[im 25/55]
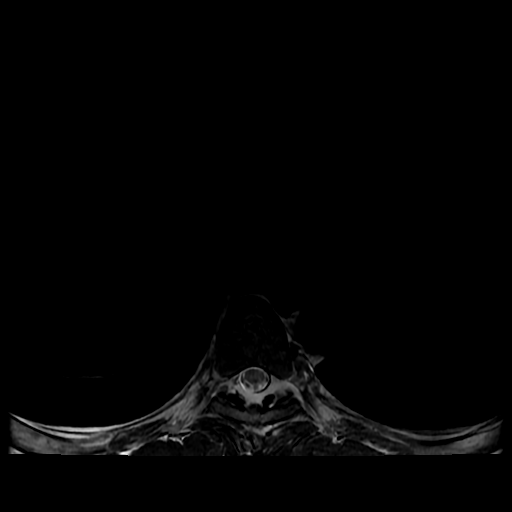
[im 30/55]
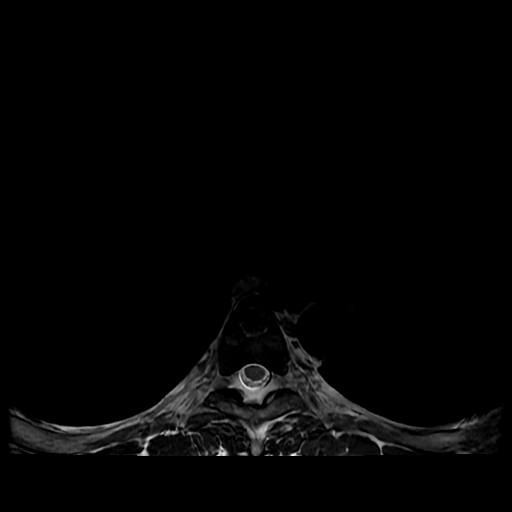
[im 40/55]
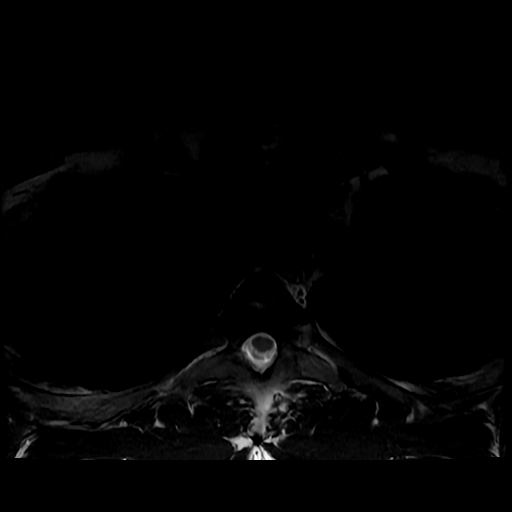
[im 45/55]
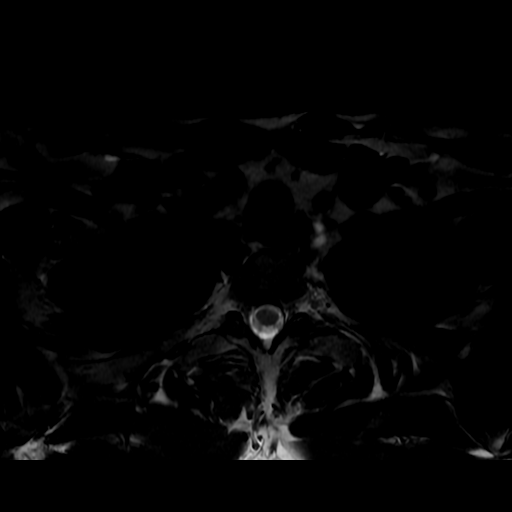
[im 50/55]
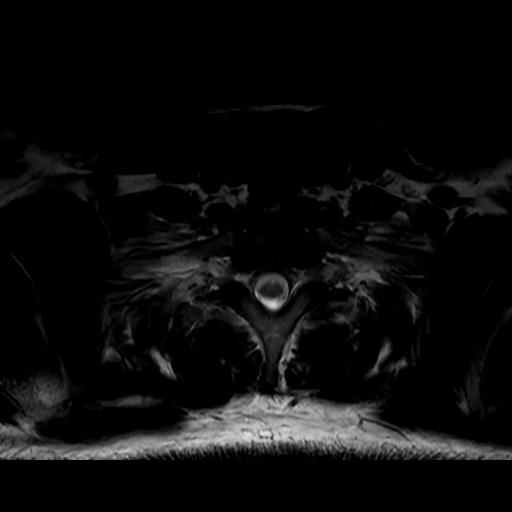

[17 of 48 positions shown; findings below may reference images not displayed]

FINDINGS: MRI THORACIC SPINE FINDINGS

Alignment: Physiologic with preservation of the normal thoracic
kyphosis. No listhesis or static subluxation.

Vertebrae: Vertebral body height maintained without acute or chronic
fracture. Bone marrow signal intensity diffusely decreased on T1
weighted imaging, nonspecific, but most commonly related to anemia,
smoking, or obesity. No discrete or worrisome osseous lesions. No
abnormal marrow edema.

Cord: Normal signal and morphology. No evidence for traumatic cord
injury or ligamentous disruption. No epidural hematoma or other
collections.

Paraspinal and other soft tissues: Paraspinous soft tissues
demonstrate no acute finding. 1 cm simple cyst partially visualize
within the left kidney. Visualized visceral structures otherwise
unremarkable.

Disc levels:

No significant disc pathology seen within the thoracic spine.
Intervertebral discs are well hydrated with preserved disc height.
No disc bulge or focal disc herniation. No significant stenosis or
evidence for neural impingement.

MRI LUMBAR SPINE FINDINGS

Segmentation: Transitional lumbosacral anatomy with a partially
sacralized S1 segment. Rudimentary S1-2 disc is present.

Alignment: Physiologic with preservation of the normal lumbar
lordosis. No significant listhesis or static subluxation.

Vertebrae: Vertebral body height maintained without acute or chronic
fracture. Bone marrow signal intensity somewhat diffusely decreased
on T1 weighted imaging, nonspecific, but most commonly related to
anemia, smoking or obesity. No discrete or worrisome osseous
lesions. No abnormal marrow edema.

Conus medullaris and cauda equina: Conus extends to the L1 level.
Conus and cauda equina appear normal.

Paraspinal and other soft tissues: Unremarkable.

Disc levels:

L1-2:  Unremarkable.

L2-3:  Unremarkable.

L3-4:  Unremarkable.

L4-5: Normal interspace. Minimal facet spurring. No canal or
foraminal stenosis.

L5-S1: Degenerative intervertebral disc space narrowing with diffuse
disc bulge and disc desiccation. Superimposed mild reactive endplate
spurring. Broad-based right foraminal to extraforaminal disc
protrusion contacts and mildly displaces the exiting right L5 nerve
root as it courses of the right neural foramen (series 7, image 31).
Additional small central disc protrusion with associated annular
fissure, slightly eccentric to the left (series 7, image 32).
Protruding disc closely approximates both of the descending S1 nerve
roots as they course inferiorly, greater on the left. Mild facet
hypertrophy. No significant spinal stenosis. Mild bilateral L5
foraminal narrowing.

S1-2: Transitional lumbosacral anatomy with rudimentary S1-2
interspace. No disc bulge or focal disc herniation. Minimal facet
degeneration. No canal or foraminal stenosis. No impingement.
IMPRESSION: 1. No acute traumatic injury within the thoracic or lumbar spine.
2. Broad-based right foraminal to extraforaminal disc protrusion at
L5-S1, contacting and mildly displacing the exiting right L5 nerve
root.
3. Additional small central disc protrusion at L5-S1, closely
approximating and potentially irritating either of the descending S1
nerve roots, greater on the left.
4. No other significant disc pathology or stenosis within the
thoracic or lumbar spine.
5. Transitional lumbosacral anatomy with a partially sacralized S1
segment. Careful correlation with numbering system once exam
recommended prior to any potential future intervention.
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
## 2020-03-15 MED ORDER — SODIUM CHLORIDE 0.9 % IV BOLUS
1000.0000 mL | Freq: Once | INTRAVENOUS | Status: AC
  Administered 2020-03-15: 1000 mL via INTRAVENOUS

## 2020-03-15 MED ORDER — FENTANYL CITRATE (PF) 100 MCG/2ML IJ SOLN
25.0000 ug | Freq: Once | INTRAMUSCULAR | Status: AC
  Administered 2020-03-15: 25 ug via INTRAVENOUS
  Filled 2020-03-15: qty 2

## 2020-03-15 MED ORDER — IOHEXOL 300 MG/ML  SOLN
100.0000 mL | Freq: Once | INTRAMUSCULAR | Status: AC | PRN
  Administered 2020-03-15: 100 mL via INTRAVENOUS

## 2020-03-15 MED ORDER — CYCLOBENZAPRINE HCL 10 MG PO TABS: 10.0000 mg | ORAL_TABLET | Freq: Two times a day (BID) | ORAL | 0 refills | Status: DC | PRN

## 2020-03-15 NOTE — ED Notes (Signed)
Pt went to MRI.

## 2020-03-15 NOTE — ED Notes (Signed)
Pt rang bathroom call bell in lobby and stated that he thinks he had a small amount of blood in his stool

## 2020-03-15 NOTE — Discharge Instructions (Addendum)
You were evaluated in the Emergency Department and after careful evaluation, we did not find any emergent condition requiring admission or further testing in the hospital.  Your exam/testing today was overall reassuring.  Imaging does not show any significant injuries or broken bones.  You do have some herniated disks pushing on some nerves that is likely causing your leg numbness/weakness.  We recommend follow-up with the spine experts.  Please return to the Emergency Department if you experience any worsening of your condition.  Thank you for allowing [place] to be a part of your care.
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
## 2020-03-15 NOTE — ED Notes (Signed)
Pt to MRI
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
## 2020-03-16 ENCOUNTER — Telehealth: Payer: Self-pay

## 2020-03-16 NOTE — Telephone Encounter (Signed)
Transition Care Management Follow-up Telephone Call  Date of discharge and from where: 03/15/2020 [facility]  How have you been since you were released from the hospital? Still sore and in pain.  Any questions or concerns? Yes, billing questions. Advised patient Mychart will sometime show the total balance. Confirmed address on file where bill will be mailed.   Items Reviewed:  Did the pt receive and understand the discharge instructions provided? Yes   Medications obtained and verified? No  will pick up this morning.   Other? No   Any new allergies since your discharge? No   Dietary orders reviewed? Yes  Do you have support at home? Yes   Home Care and Equipment/Supplies: Were home health services ordered? not applicable If so, what is the name of the agency? na  Has the agency set up a time to come to the patient's home? not applicable Were any new equipment or medical supplies ordered?  No What is the name of the medical supply agency? na Were you able to get the supplies/equipment? not applicable Do you have any questions related to the use of the equipment or supplies? No  Functional Questionnaire: (I = Independent and D = Dependent) ADLs: I  Bathing/Dressing- I  Meal Prep- I  Eating- I  Maintaining continence- I  Transferring/Ambulation- I  Managing Meds- I  Follow up appointments reviewed:   PCP Hospital f/u appt confirmed? No    Specialist Hospital f/u appt confirmed? No  Patietn stated he will contact [facility] this morning.   Are transportation arrangements needed? No   If their condition worsens, is the pt aware to call PCP or go to the Emergency Dept.? Yes  Was the patient provided with contact information for the PCP's office or ED? Yes  Was to pt encouraged to call back with questions or concerns? Yes

## 2020-03-18 ENCOUNTER — Emergency Department (HOSPITAL_COMMUNITY): Payer: BC Managed Care – PPO

## 2020-03-18 ENCOUNTER — Other Ambulatory Visit: Payer: Self-pay

## 2020-03-18 ENCOUNTER — Encounter (HOSPITAL_COMMUNITY): Payer: Self-pay | Admitting: Emergency Medicine

## 2020-03-18 ENCOUNTER — Emergency Department (HOSPITAL_COMMUNITY)
Admission: EM | Admit: 2020-03-18 | Discharge: 2020-03-18 | Disposition: A | Discharge status: Home / Self Care | Payer: BC Managed Care – PPO | Attending: Emergency Medicine | Admitting: Emergency Medicine

## 2020-03-18 DIAGNOSIS — S39012D Strain of muscle, fascia and tendon of lower back, subsequent encounter: Secondary | ICD-10-CM

## 2020-03-18 DIAGNOSIS — Y92410 Unspecified street and highway as the place of occurrence of the external cause: Secondary | ICD-10-CM | POA: Diagnosis not present

## 2020-03-18 DIAGNOSIS — M5410 Radiculopathy, site unspecified: Secondary | ICD-10-CM

## 2020-03-18 DIAGNOSIS — Y999 Unspecified external cause status: Secondary | ICD-10-CM | POA: Diagnosis not present

## 2020-03-18 DIAGNOSIS — S3992XA Unspecified injury of lower back, initial encounter: Secondary | ICD-10-CM | POA: Diagnosis not present

## 2020-03-18 DIAGNOSIS — S39012A Strain of muscle, fascia and tendon of lower back, initial encounter: Secondary | ICD-10-CM | POA: Insufficient documentation

## 2020-03-18 DIAGNOSIS — R0789 Other chest pain: Secondary | ICD-10-CM | POA: Diagnosis not present

## 2020-03-18 DIAGNOSIS — Y939 Activity, unspecified: Secondary | ICD-10-CM | POA: Diagnosis not present

## 2020-03-18 DIAGNOSIS — M5416 Radiculopathy, lumbar region: Secondary | ICD-10-CM | POA: Diagnosis not present

## 2020-03-18 DIAGNOSIS — R079 Chest pain, unspecified: Secondary | ICD-10-CM | POA: Diagnosis not present

## 2020-03-18 LAB — URINALYSIS, ROUTINE W REFLEX MICROSCOPIC
Bilirubin Urine: NEGATIVE
Glucose, UA: NEGATIVE mg/dL
Hgb urine dipstick: NEGATIVE
Ketones, ur: NEGATIVE mg/dL
Leukocytes,Ua: NEGATIVE
Nitrite: NEGATIVE
Protein, ur: NEGATIVE mg/dL
Specific Gravity, Urine: 1.028 (ref 1.005–1.030)
pH: 6 (ref 5.0–8.0)

## 2020-03-18 LAB — CBC
HCT: 43.6 % (ref 39.0–52.0)
Hemoglobin: 14.7 g/dL (ref 13.0–17.0)
MCH: 29.3 pg (ref 26.0–34.0)
MCHC: 33.7 g/dL (ref 30.0–36.0)
MCV: 86.9 fL (ref 80.0–100.0)
Platelets: 186 10*3/uL (ref 150–400)
RBC: 5.02 MIL/uL (ref 4.22–5.81)
RDW: 12.7 % (ref 11.5–15.5)
WBC: 6.2 10*3/uL (ref 4.0–10.5)
nRBC: 0 % (ref 0.0–0.2)

## 2020-03-18 LAB — BASIC METABOLIC PANEL
Anion gap: 7 (ref 5–15)
BUN: 16 mg/dL (ref 6–20)
CO2: 23 mmol/L (ref 22–32)
Calcium: 9.2 mg/dL (ref 8.9–10.3)
Chloride: 106 mmol/L (ref 98–111)
Creatinine, Ser: 1.1 mg/dL (ref 0.61–1.24)
GFR, Estimated: >60 mL/min (ref >60)
Glucose, Bld: 116 mg/dL — ABNORMAL HIGH (ref 70–99)
Potassium: 3.5 mmol/L (ref 3.5–5.1)
Sodium: 136 mmol/L (ref 135–145)

## 2020-03-18 LAB — TROPONIN I (HIGH SENSITIVITY): Troponin I (High Sensitivity): 3 ng/L (ref <18)
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
## 2020-03-18 MED ORDER — PREDNISONE 10 MG (21) PO TBPK: ORAL_TABLET | Freq: Every day | ORAL | 0 refills | Status: DC
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
## 2020-03-18 NOTE — Discharge Instructions (Addendum)
I recommend a combination of tylenol and ibuprofen for management of your pain. You can take a low dose of both at the same time. I recommend 325 mg of Tylenol combined with 400 mg of ibuprofen. This is one regular Tylenol and two regular ibuprofen. You can take these 2-3 times for day for your pain. Please try to take these medications with a small amount of food as well to prevent upsetting your stomach.  Also, please consider topical pain relieving creams such as Voltaran Gel, BioFreeze, or Icy Hot. There is also a pain relieving cream made by [employer]. You should be able to find all of these at your local pharmacy.   I am also prescribing you prednisone.  This is a tapered medication you can take early in the morning to hopefully help with the tingling in your left leg.  This medication can be stimulating so try to take it earlier in the day as it can cause difficulty sleeping.  If your symptoms worsen please return to the emergency department.  Please follow-up with the neurosurgeon that you were referred to.  It was a pleasure to meet you.

## 2020-03-19 ENCOUNTER — Telehealth: Payer: Self-pay

## 2020-03-19 NOTE — Telephone Encounter (Signed)
Transition Care Management Follow-up Telephone Call  Date of discharge and from where: 03/18/2020 [facility]  How have you been since you were released from the hospital? Still sore. Awaiting call from [facility]  Any questions or concerns? No  Items Reviewed:  Did the pt receive and understand the discharge instructions provided? Yes   Medications obtained and verified? No   Other? No   Any new allergies since your discharge? No   Dietary orders reviewed? Yes  Do you have support at home? Yes   Home Care and Equipment/Supplies: Were home health services ordered? not applicable If so, what is the name of the agency? na  Has the agency set up a time to come to the patient's home? not applicable Were any new equipment or medical supplies ordered?  No What is the name of the medical supply agency? na Were you able to get the supplies/equipment? not applicable Do you have any questions related to the use of the equipment or supplies? No  Functional Questionnaire: (I = Independent and D = Dependent) ADLs: I  Bathing/Dressing- I  Meal Prep- I  Eating- I  Maintaining continence- I  Transferring/Ambulation- I  Managing Meds- I  Follow up appointments reviewed:   PCP Hospital f/u appt confirmed? No    Specialist Hospital f/u appt confirmed? No   Are transportation arrangements needed? No   If their condition worsens, is the pt aware to call PCP or go to the Emergency Dept.? Yes  Was the patient provided with contact information for the PCP's office or ED? Yes  Was to pt encouraged to call back with questions or concerns? Yes

## 2020-03-20 ENCOUNTER — Telehealth: Payer: Self-pay | Admitting: Nurse Practitioner
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
## 2020-03-20 NOTE — Telephone Encounter (Signed)
Needs to schedule face to face with me. If unable to schedule appt with me then form needs to be adressed by specialist.

## 2020-03-20 NOTE — Telephone Encounter (Signed)
LVM for patient to return call. 

## 2020-03-29 ENCOUNTER — Other Ambulatory Visit: Payer: Self-pay

## 2020-03-30 ENCOUNTER — Ambulatory Visit: Payer: BC Managed Care – PPO | Admitting: Nurse Practitioner

## 2020-04-02 ENCOUNTER — Other Ambulatory Visit: Payer: Self-pay

## 2020-04-03 ENCOUNTER — Telehealth: Payer: Self-pay

## 2020-04-03 ENCOUNTER — Ambulatory Visit (INDEPENDENT_AMBULATORY_CARE_PROVIDER_SITE_OTHER): Payer: BC Managed Care – PPO | Admitting: Nurse Practitioner

## 2020-04-03 ENCOUNTER — Encounter: Payer: Self-pay | Admitting: Nurse Practitioner

## 2020-04-03 VITALS — BP 116/70 | HR 70 | Temp 97.6°F | Ht 73.0 in | Wt 204.2 lb

## 2020-04-03 DIAGNOSIS — K5900 Constipation, unspecified: Secondary | ICD-10-CM

## 2020-04-03 DIAGNOSIS — M5442 Lumbago with sciatica, left side: Secondary | ICD-10-CM | POA: Diagnosis not present

## 2020-04-03 DIAGNOSIS — M5126 Other intervertebral disc displacement, lumbar region: Secondary | ICD-10-CM | POA: Diagnosis not present

## 2020-04-03 MED ORDER — DOCUSATE SODIUM 100 MG PO CAPS: 100.0000 mg | ORAL_CAPSULE | Freq: Two times a day (BID) | ORAL | 0 refills | Status: DC

## 2020-04-03 MED ORDER — METHOCARBAMOL 750 MG PO TABS: 750.0000 mg | ORAL_TABLET | Freq: Every day | ORAL | 0 refills | Status: DC

## 2020-04-03 MED ORDER — IBUPROFEN 600 MG PO TABS: 600.0000 mg | ORAL_TABLET | Freq: Three times a day (TID) | ORAL | 0 refills | Status: DC | PRN
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
## 2020-04-03 NOTE — Telephone Encounter (Signed)
Pt called and stated that [facility] told him that will not fill out the paperwork even after you've been seen.  Pt said they informed him that, they only fill out paper work due to surgery.  Pt would like a phone call back or a Mychart message on where to go from here.  Please advise.

## 2020-04-03 NOTE — Patient Instructions (Addendum)
Reschedule appt with [facility]. . Use ibuprofen and robaxin to manage back pain.  Have forms faxed to use: Work modification forms.   Acute Back Pain, Adult Acute back pain is sudden and usually short-lived. It is often caused by an injury to the muscles and tissues in the back. The injury may result from:  A muscle or ligament getting overstretched or torn (strained). Ligaments are tissues that connect bones to each other. Lifting something improperly can cause a back strain.  Wear and tear (degeneration) of the spinal disks. Spinal disks are circular tissue that provides cushioning between the bones of the spine (vertebrae).  Twisting motions, such as while playing sports or doing yard work.  A hit to the back.  Arthritis. You may have a physical exam, lab tests, and imaging tests to find the cause of your pain. Acute back pain usually goes away with rest and home care. Follow these instructions at home: Managing pain, stiffness, and swelling  Take over-the-counter and prescription medicines only as told by your health care provider.  Your health care provider may recommend applying ice during the first 24-48 hours after your pain starts. To do this: ? Put ice in a plastic bag. ? Place a towel between your skin and the bag. ? Leave the ice on for 20 minutes, 2-3 times a day.  If directed, apply heat to the affected area as often as told by your health care provider. Use the heat source that your health care provider recommends, such as a moist heat pack or a heating pad. ? Place a towel between your skin and the heat source. ? Leave the heat on for 20-30 minutes. ? Remove the heat if your skin turns bright red. This is especially important if you are unable to feel pain, heat, or cold. You have a greater risk of getting burned. Activity   Do not stay in bed. Staying in bed for more than 1-2 days can delay your recovery.  Sit up and stand up straight. Avoid leaning  forward when you sit, or hunching over when you stand. ? If you work at a desk, sit close to it so you do not need to lean over. Keep your chin tucked in. Keep your neck drawn back, and keep your elbows bent at a right angle. Your arms should look like the letter "L." ? Sit high and close to the steering wheel when you drive. Add lower back (lumbar) support to your car seat, if needed.  Take short walks on even surfaces as soon as you are able. Try to increase the length of time you walk each day.  Do not sit, drive, or stand in one place for more than 30 minutes at a time. Sitting or standing for long periods of time can put stress on your back.  Do not drive or use heavy machinery while taking prescription pain medicine.  Use proper lifting techniques. When you bend and lift, use positions that put less stress on your back: ? [place] your knees. ? Keep the load close to your body. ? Avoid twisting.  Exercise regularly as told by your health care provider. Exercising helps your back heal faster and helps prevent back injuries by keeping muscles strong and flexible.  Work with a physical therapist to make a safe exercise program, as recommended by your health care provider. Do any exercises as told by your physical therapist. Lifestyle  Maintain a healthy weight. Extra weight puts stress on your back  and makes it difficult to have good posture.  Avoid activities or situations that make you feel anxious or stressed. Stress and anxiety increase muscle tension and can make back pain worse. Learn ways to manage anxiety and stress, such as through exercise. General instructions  Sleep on a firm mattress in a comfortable position. Try lying on your side with your knees slightly bent. If you lie on your back, put a pillow under your knees.  Follow your treatment plan as told by your health care provider. This may include: ? Cognitive or behavioral therapy. ? Acupuncture or massage  therapy. ? Meditation or yoga. Contact a health care provider if:  You have pain that is not relieved with rest or medicine.  You have increasing pain going down into your legs or buttocks.  Your pain does not improve after 2 weeks.  You have pain at night.  You lose weight without trying.  You have a fever or chills. Get help right away if:  You develop new bowel or bladder control problems.  You have unusual weakness or numbness in your arms or legs.  You develop nausea or vomiting.  You develop abdominal pain.  You feel faint. Summary  Acute back pain is sudden and usually short-lived.  Use proper lifting techniques. When you bend and lift, use positions that put less stress on your back.  Take over-the-counter and prescription medicines and apply heat or ice as directed by your health care provider. This information is not intended to replace advice given to you by your health care provider. Make sure you discuss any questions you have with your health care provider. Document Revised: 08/03/2018 Document Reviewed: 11/26/2016 Elsevier Patient Education  2020 [employer].

## 2020-04-04 NOTE — Telephone Encounter (Signed)
He is to have his HR fax form for work modification. I still recommend he schedule an appt with neurosurgery due to abnormal MRI lumbar spine.

## 2020-04-04 NOTE — Telephone Encounter (Signed)
Pt states he does not think that the accommodations that we will be requesting will help his case but he will send the paperwork over to our office once he speaks with the HR department again. Pt states he thinks he will just have to take the medication and work through it, but regardless he will call our office and keep [place] informed if he does need [place]. FYI

## 2020-04-11 ENCOUNTER — Other Ambulatory Visit: Payer: Self-pay

## 2020-04-11 ENCOUNTER — Emergency Department (HOSPITAL_COMMUNITY)
Admission: EM | Admit: 2020-04-11 | Discharge: 2020-04-12 | Disposition: A | Discharge status: Home / Self Care | Payer: BC Managed Care – PPO | Attending: Emergency Medicine | Admitting: Emergency Medicine

## 2020-04-11 ENCOUNTER — Encounter (HOSPITAL_COMMUNITY): Payer: Self-pay | Admitting: *Deleted

## 2020-04-11 DIAGNOSIS — R079 Chest pain, unspecified: Secondary | ICD-10-CM | POA: Diagnosis not present

## 2020-04-11 DIAGNOSIS — R0789 Other chest pain: Secondary | ICD-10-CM

## 2020-04-11 DIAGNOSIS — Q7649 Other congenital malformations of spine, not associated with scoliosis: Secondary | ICD-10-CM | POA: Diagnosis not present

## 2020-04-11 DIAGNOSIS — M545 Low back pain, unspecified: Secondary | ICD-10-CM | POA: Insufficient documentation

## 2020-04-11 DIAGNOSIS — M25561 Pain in right knee: Secondary | ICD-10-CM

## 2020-04-11 DIAGNOSIS — J45909 Unspecified asthma, uncomplicated: Secondary | ICD-10-CM | POA: Diagnosis not present

## 2020-04-11 DIAGNOSIS — Y92410 Unspecified street and highway as the place of occurrence of the external cause: Secondary | ICD-10-CM | POA: Insufficient documentation

## 2020-04-11 DIAGNOSIS — R109 Unspecified abdominal pain: Secondary | ICD-10-CM | POA: Diagnosis not present

## 2020-04-11 DIAGNOSIS — J3489 Other specified disorders of nose and nasal sinuses: Secondary | ICD-10-CM | POA: Diagnosis not present

## 2020-04-11 DIAGNOSIS — Q766 Other congenital malformations of ribs: Secondary | ICD-10-CM | POA: Diagnosis not present

## 2020-04-11 DIAGNOSIS — M542 Cervicalgia: Secondary | ICD-10-CM | POA: Insufficient documentation

## 2020-04-11 DIAGNOSIS — R519 Headache, unspecified: Secondary | ICD-10-CM | POA: Insufficient documentation

## 2020-04-11 DIAGNOSIS — Q433 Congenital malformations of intestinal fixation: Secondary | ICD-10-CM | POA: Diagnosis not present

## 2020-04-11 NOTE — ED Notes (Signed)
Pt called x 3  No answer. 
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
## 2020-04-12 ENCOUNTER — Emergency Department (HOSPITAL_COMMUNITY): Payer: BC Managed Care – PPO

## 2020-04-12 DIAGNOSIS — M545 Low back pain, unspecified: Secondary | ICD-10-CM | POA: Diagnosis not present

## 2020-04-12 DIAGNOSIS — Q433 Congenital malformations of intestinal fixation: Secondary | ICD-10-CM | POA: Diagnosis not present

## 2020-04-12 DIAGNOSIS — J3489 Other specified disorders of nose and nasal sinuses: Secondary | ICD-10-CM | POA: Diagnosis not present

## 2020-04-12 DIAGNOSIS — R519 Headache, unspecified: Secondary | ICD-10-CM | POA: Diagnosis not present

## 2020-04-12 DIAGNOSIS — R109 Unspecified abdominal pain: Secondary | ICD-10-CM | POA: Diagnosis not present

## 2020-04-12 DIAGNOSIS — Q7649 Other congenital malformations of spine, not associated with scoliosis: Secondary | ICD-10-CM | POA: Diagnosis not present

## 2020-04-12 DIAGNOSIS — M542 Cervicalgia: Secondary | ICD-10-CM | POA: Diagnosis not present

## 2020-04-12 DIAGNOSIS — R079 Chest pain, unspecified: Secondary | ICD-10-CM | POA: Diagnosis not present

## 2020-04-12 DIAGNOSIS — M25561 Pain in right knee: Secondary | ICD-10-CM | POA: Diagnosis not present

## 2020-04-12 DIAGNOSIS — Q766 Other congenital malformations of ribs: Secondary | ICD-10-CM | POA: Diagnosis not present

## 2020-04-12 LAB — CBC WITH DIFFERENTIAL/PLATELET
Abs Immature Granulocytes: 0.02 10*3/uL (ref 0.00–0.07)
Basophils Absolute: 0 10*3/uL (ref 0.0–0.1)
Basophils Relative: 1 %
Eosinophils Absolute: 0.2 10*3/uL (ref 0.0–0.5)
Eosinophils Relative: 3 %
HCT: 45.7 % (ref 39.0–52.0)
Hemoglobin: 15 g/dL (ref 13.0–17.0)
Immature Granulocytes: 0 %
Lymphocytes Relative: 38 %
Lymphs Abs: 2.6 10*3/uL (ref 0.7–4.0)
MCH: 28.7 pg (ref 26.0–34.0)
MCHC: 32.8 g/dL (ref 30.0–36.0)
MCV: 87.5 fL (ref 80.0–100.0)
Monocytes Absolute: 0.9 10*3/uL (ref 0.1–1.0)
Monocytes Relative: 13 %
Neutro Abs: 3 10*3/uL (ref 1.7–7.7)
Neutrophils Relative %: 45 %
Platelets: 190 10*3/uL (ref 150–400)
RBC: 5.22 MIL/uL (ref 4.22–5.81)
RDW: 12.6 % (ref 11.5–15.5)
WBC: 6.7 10*3/uL (ref 4.0–10.5)
nRBC: 0 % (ref 0.0–0.2)

## 2020-04-12 LAB — URINALYSIS, ROUTINE W REFLEX MICROSCOPIC
Bilirubin Urine: NEGATIVE
Glucose, UA: NEGATIVE mg/dL
Hgb urine dipstick: NEGATIVE
Ketones, ur: NEGATIVE mg/dL
Leukocytes,Ua: NEGATIVE
Nitrite: NEGATIVE
Protein, ur: NEGATIVE mg/dL
Specific Gravity, Urine: >1.046 — ABNORMAL HIGH (ref 1.005–1.030)
pH: 5 (ref 5.0–8.0)

## 2020-04-12 LAB — BASIC METABOLIC PANEL
Anion gap: 9 (ref 5–15)
BUN: 13 mg/dL (ref 6–20)
CO2: 27 mmol/L (ref 22–32)
Calcium: 9.8 mg/dL (ref 8.9–10.3)
Chloride: 100 mmol/L (ref 98–111)
Creatinine, Ser: 1.11 mg/dL (ref 0.61–1.24)
GFR, Estimated: >60 mL/min (ref >60)
Glucose, Bld: 105 mg/dL — ABNORMAL HIGH (ref 70–99)
Potassium: 4 mmol/L (ref 3.5–5.1)
Sodium: 136 mmol/L (ref 135–145)
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
## 2020-04-12 MED ORDER — IOHEXOL 300 MG/ML  SOLN
100.0000 mL | Freq: Once | INTRAMUSCULAR | Status: AC | PRN
  Administered 2020-04-12: 100 mL via INTRAVENOUS

## 2020-04-12 MED ORDER — KETOROLAC TROMETHAMINE 30 MG/ML IJ SOLN
30.0000 mg | Freq: Once | INTRAMUSCULAR | Status: AC
  Administered 2020-04-12: 30 mg via INTRAVENOUS
  Filled 2020-04-12: qty 1

## 2020-04-12 NOTE — Discharge Instructions (Addendum)
Can take tylenol or motrin for pain.  Heat or ice on affected areas may help with soreness/stiffness as well. Return here for any new/acute changes.   At this time there does not appear to be the presence of an emergent medical condition, however there is always the potential for conditions to change. Please read and follow the below instructions.  Please return to the Emergency Department immediately for any new or worsening symptoms. Please be sure to follow up with your Primary Care Provider within one week regarding your visit today; please call their office to schedule an appointment even if you are feeling better for a follow-up visit. You may call Dr. Luvenia Starch office to schedule follow-up appointment for further evaluation of your pain.  Go to the nearest Emergency Department immediately if: You have fever or chills You develop new bowel or bladder control problems. You have unusual weakness or numbness in your arms or legs. You develop nausea or vomiting. You develop abdominal pain. You feel faint. Your knee swells, and the swelling gets worse. You cannot move your knee. You have very bad knee pain. You have any new/concerning or worsening of symptoms  Please read the additional information packets attached to your discharge summary.  Do not take your medicine if  develop an itchy rash, swelling in your mouth or lips, or difficulty breathing; call 911 and seek immediate emergency medical attention if this occurs.  You may review your lab tests and imaging results in their entirety on your MyChart account.  Please discuss all results of fully with your primary care provider and other specialist at your follow-up visit.  Note: Portions of this text may have been transcribed using voice recognition software. Every effort was made to ensure accuracy; however, inadvertent computerized transcription errors may still be present.

## 2020-04-12 NOTE — ED Notes (Signed)
Returned from CT scan.

## 2020-04-12 NOTE — ED Notes (Signed)
Transported to CT 

## 2020-04-12 NOTE — Progress Notes (Signed)
Orthopedic Tech Progress Note Patient Details:  Ian Spencer 12-Mar-1991 [id number]  Ortho Devices Type of Ortho Device: Crutches Ortho Device/Splint Interventions: Ian Spencer 04/12/2020, 9:34 AM
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
## 2020-04-13 ENCOUNTER — Telehealth: Payer: Self-pay

## 2020-04-13 NOTE — Telephone Encounter (Signed)
Transition Care Management Unsuccessful Follow-up Telephone Call  Date of discharge and from where:  04/12/2020 [facility]  Attempts:  1st Attempt  Reason for unsuccessful TCM follow-up call:  No answer/busy

## 2020-04-16 NOTE — Telephone Encounter (Signed)
Transition Care Management Unsuccessful Follow-up Telephone Call  Date of discharge and from where:  04/12/20 [facility]  Attempts:  2nd Attempt  Reason for unsuccessful TCM follow-up call:  Left voice message

## 2020-04-17 ENCOUNTER — Telehealth: Payer: Self-pay

## 2020-04-17 DIAGNOSIS — M25561 Pain in right knee: Secondary | ICD-10-CM | POA: Diagnosis not present

## 2020-04-17 DIAGNOSIS — M25562 Pain in left knee: Secondary | ICD-10-CM | POA: Diagnosis not present

## 2020-04-17 NOTE — Telephone Encounter (Signed)
Transition Care Management Unsuccessful Follow-up Telephone Call  Date of discharge and from where:  04/12/2020 from [facility]  Attempts:  3rd Attempt  Reason for unsuccessful TCM follow-up call:  Left voice message

## 2020-04-17 NOTE — Telephone Encounter (Signed)
Transition Care Management Unsuccessful Follow-up Telephone Call  Date of discharge and from where:  04/16/2020 from [facility]  Attempts:  1st Attempt  Reason for unsuccessful TCM follow-up call:  Left voice message

## 2020-04-18 NOTE — Telephone Encounter (Signed)
Transition Care Management Unsuccessful Follow-up Telephone Call  Date of discharge and from where:   04/16/2020 from [facility]   Attempts:  2nd Attempt  Reason for unsuccessful TCM follow-up call:  Left voice message

## 2020-04-19 DIAGNOSIS — M5106 Intervertebral disc disorders with myelopathy, lumbar region: Secondary | ICD-10-CM | POA: Diagnosis not present

## 2020-04-19 DIAGNOSIS — M5416 Radiculopathy, lumbar region: Secondary | ICD-10-CM | POA: Diagnosis not present

## 2020-04-19 DIAGNOSIS — M545 Low back pain, unspecified: Secondary | ICD-10-CM | POA: Diagnosis not present

## 2020-04-19 NOTE — Telephone Encounter (Signed)
Transition Care Management Unsuccessful Follow-up Telephone Call  [facility]  Attempts:  3rd Attempt  Reason for unsuccessful TCM follow-up call:  Left voice message

## 2020-04-24 ENCOUNTER — Other Ambulatory Visit: Payer: Self-pay | Admitting: Student

## 2020-04-24 DIAGNOSIS — M25561 Pain in right knee: Secondary | ICD-10-CM

## 2020-04-25 ENCOUNTER — Ambulatory Visit
Admission: RE | Admit: 2020-04-25 | Discharge: 2020-04-25 | Disposition: A | Discharge status: Home / Self Care | Payer: Self-pay | Source: Ambulatory Visit | Attending: Student | Admitting: Student

## 2020-04-25 ENCOUNTER — Other Ambulatory Visit: Payer: Self-pay

## 2020-04-25 DIAGNOSIS — S8991XA Unspecified injury of right lower leg, initial encounter: Secondary | ICD-10-CM | POA: Diagnosis not present

## 2020-04-25 DIAGNOSIS — M25561 Pain in right knee: Secondary | ICD-10-CM
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
## 2020-10-09 ENCOUNTER — Other Ambulatory Visit: Payer: Self-pay

## 2020-10-10 ENCOUNTER — Ambulatory Visit: Payer: Medicaid Other | Admitting: Nurse Practitioner

## 2020-10-10 ENCOUNTER — Other Ambulatory Visit: Payer: Self-pay

## 2020-10-11 ENCOUNTER — Telehealth: Payer: Self-pay | Admitting: Nurse Practitioner

## 2020-10-11 ENCOUNTER — Ambulatory Visit (INDEPENDENT_AMBULATORY_CARE_PROVIDER_SITE_OTHER): Payer: BC Managed Care – PPO | Admitting: Nurse Practitioner

## 2020-10-11 ENCOUNTER — Encounter: Payer: Self-pay | Admitting: Nurse Practitioner

## 2020-10-11 VITALS — BP 120/74 | HR 76 | Temp 98.6°F | Ht 73.0 in | Wt 197.4 lb

## 2020-10-11 DIAGNOSIS — F333 Major depressive disorder, recurrent, severe with psychotic symptoms: Secondary | ICD-10-CM

## 2020-10-11 DIAGNOSIS — F4321 Adjustment disorder with depressed mood: Secondary | ICD-10-CM | POA: Diagnosis not present

## 2020-10-11 MED ORDER — BUPROPION HCL ER (SR) 100 MG PO TB12: 100.0000 mg | ORAL_TABLET | Freq: Two times a day (BID) | ORAL | 5 refills | Status: DC

## 2020-10-11 NOTE — Patient Instructions (Addendum)
Call office today with name of medications used for anxiety and depression. You will be contacted to schedule appt with psychiatry and psychology. Use phone app called headspace to help with meditation. Look into psychologytoday.com for other psychologist options. Managing Depression, Adult Depression is a mental health condition that affects your thoughts, feelings, and actions. Being diagnosed with depression can bring you relief if you did not know why you have felt or behaved a certain way. It could also leave you feeling overwhelmed with uncertainty about your future. Preparing yourself tomanage your symptoms can help you feel more positive about your future. How to manage lifestyle changes Managing stress  Stress is your body's reaction to life changes and events, both good and bad. Stress can add to your feelings of depression. Learning to manage your stresscan help lessen your feelings of depression. Try some of the following approaches to reducing your stress (stress reduction techniques): Listen to music that you enjoy and that inspires you. Try using a meditation app or take a meditation class. Develop a practice that helps you connect with your spiritual self. Walk in nature, pray, or go to a place of worship. Do some deep breathing. To do this, inhale slowly through your nose. Pause at the top of your inhale for a few seconds and then exhale slowly, letting your muscles relax. Practice yoga to help relax and work your muscles. Choose a stress reduction technique that suits your lifestyle and personality. These techniques take time and practice to develop. Set aside 5-15 minutes a day to do them. Therapists can offer training in these techniques. Other things you can do to manage stress include: Keeping a stress diary. Knowing your limits and saying no when you think something is too much. Paying attention to how you react to certain situations. You may not be able to control  everything, but you can change your reaction. Adding humor to your life by watching funny films or TV shows. Making time for activities that you enjoy and that relax you.  Medicines Medicines, such as antidepressants, are often a part of treatment for depression. Talk with your pharmacist or health care provider about all the medicines, supplements, and herbal products that you take, their possible side effects, and what medicines and other products are safe to take together. Make sure to report any side effects you may have to your health care provider. Relationships Your health care provider may suggest family therapy, couples therapy, orindividual therapy as part of your treatment. How to recognize changes Everyone responds differently to treatment for depression. As you recover from depression, you may start to: Have more interest in doing activities. Feel less hopeless. Have more energy. Overeat less often, or have a better appetite. Have better mental focus. It is important to recognize if your depression is not getting better or is getting worse. The symptoms you had in the beginning may return, such as: Tiredness (fatigue) or low energy. Eating too much or too little. Sleeping too much or too little. Feeling restless, agitated, or hopeless. Trouble focusing or making decisions. Unexplained physical complaints. Feeling irritable, angry, or aggressive. If you or your family members notice these symptoms coming back, let yourhealth care provider know right away. Follow these instructions at home: Activity  Try to get some form of exercise each day, such as walking, biking, swimming, or lifting weights. Practice stress reduction techniques. Engage your mind by taking a class or doing some volunteer work.  Lifestyle Get the right amount and quality  of sleep. Cut down on using caffeine, tobacco, alcohol, and other potentially harmful substances. Eat a healthy diet that includes  plenty of vegetables, fruits, whole grains, low-fat dairy products, and lean protein. Do not eat a lot of foods that are high in solid fats, added sugars, or salt (sodium). General instructions Take over-the-counter and prescription medicines only as told by your health care provider. Keep all follow-up visits as told by your health care provider. This is important. Where to find support Talking to others  Friends and family members can be sources of support and guidance. Talk to trusted friends or family members about your condition. Explain your symptoms to them, and let them know that you are working with a health care provider to treat your depression. Tell friends and family members how they also can behelpful. Finances Find appropriate mental health providers that fit with your financial situation. Talk with your health care provider about options to get reduced prices on your medicines. Where to find more information You can find support in your area from: Anxiety and Depression Association of America (ADAA): www.adaa.org Mental Health America: www.mentalhealthamerica.net [employer] on Mental Illness: www.nami.org Contact a health care provider if: You stop taking your antidepressant medicines, and you have any of these symptoms: Nausea. Headache. Light-headedness. Chills and body aches. Not being able to sleep (insomnia). You or your friends and family think your depression is getting worse. Get help right away if: You have thoughts of hurting yourself or others. If you ever feel like you may hurt yourself or others, or have thoughts about taking your own life, get help right away. Go to your nearest emergency department or: Call your local emergency services (911 in the [place] Call a suicide crisis helpline, such as the National Suicide Prevention Lifeline at (610) 522-8187. This is open 24 hours a day in the [place] Text the Crisis Text Line at 810-182-6325 (in the  [place] Summary If you are diagnosed with depression, preparing yourself to manage your symptoms is a good way to feel positive about your future. Work with your health care provider on a management plan that includes stress reduction techniques, medicines (if applicable), therapy, and healthy lifestyle habits. Keep talking with your health care provider about how your treatment is working. If you have thoughts about taking your own life, call a suicide crisis helpline or text a crisis text line. This information is not intended to replace advice given to you by your health care provider. Make sure you discuss any questions you have with your healthcare provider. Document Revised: 02/23/2019 Document Reviewed: 02/23/2019 Elsevier Patient Education  2022 [employer].

## 2020-10-11 NOTE — Progress Notes (Signed)
Subjective:  Patient ID: Ian Spencer, male    DOB: Nov 20, 1990  Age: 30 y.o. MRN: [id number]  CC: Follow-up (Depression, not eating (no appetite), weight loss, not sleeping, Grandmother expired recently. Also Acne.)  HPI  Depression, recurrent (HCC) Chronic, waxing and waning, worse with recent death of grand parent. Denies any suicide ideation or previous attempt. No previous IVC, no ETOH or tobacco or illicit drug use. No FHx of mental health disorder No improvement with fluoxetine and vistaril, medications were discontinued 2weeks ago.  Start wellbutrin Entered referral to psychiatry and psychology. Provided printed information on was to manage depression while remaining active. Will complete form for intermittent FMLA. F/up in 79month  Depression screen [facility] 2/9 10/11/2020 02/15/2020  Decreased Interest 3 3  Down, Depressed, Hopeless 3 3  PHQ - 2 Score 6 6  Altered sleeping 3 3  Tired, decreased energy 3 3  Change in appetite 3 3  Feeling bad or failure about yourself  2 -  Trouble concentrating 3 3  Moving slowly or fidgety/restless 3 3  Suicidal thoughts 1 2  PHQ-9 Score 24 23  Difficult doing work/chores Extremely dIfficult Extremely dIfficult    GAD 7 : Generalized Anxiety Score 10/11/2020 02/15/2020  Nervous, Anxious, on Edge 2 3  Control/stop worrying 2 3  Worry too much - different things 2 3  Trouble relaxing 3 3  Restless 3 3  Easily annoyed or irritable 3 3  Afraid - awful might happen 2 3  Total GAD 7 Score 17 21  Anxiety Difficulty Extremely difficult Extremely difficult    Reviewed past Medical, Social and Family history today.  Outpatient Medications Prior to Visit  Medication Sig Dispense Refill   cyclobenzaprine (FLEXERIL) 10 MG tablet Take 1 tablet by mouth 3 (three) times daily.     diclofenac (VOLTAREN) 75 MG EC tablet Take 75 mg by mouth 2 (two) times daily as needed.     docusate sodium (COLACE) 100 MG capsule Take 1 capsule (100 mg total) by  mouth 2 (two) times daily. (Patient not taking: Reported on 10/11/2020) 10 capsule 0   FLUoxetine (PROZAC) 20 MG capsule  (Patient not taking: Reported on 10/11/2020)     gabapentin (NEURONTIN) 300 MG capsule Take by mouth. (Patient not taking: No sig reported)     hydrOXYzine (ATARAX/VISTARIL) 25 MG tablet  (Patient not taking: Reported on 10/11/2020)     ibuprofen (ADVIL) 600 MG tablet Take 1 tablet (600 mg total) by mouth every 8 (eight) hours as needed (with food). (Patient not taking: Reported on 10/11/2020) 30 tablet 0   levocetirizine (XYZAL) 5 MG tablet SMARTSIG:1 Tablet(s) By Mouth Every Evening (Patient not taking: Reported on 10/11/2020)     meloxicam (MOBIC) 15 MG tablet Take 1 tablet by mouth daily. (Patient not taking: Reported on 10/11/2020)     methocarbamol (ROBAXIN-750) 750 MG tablet Take 1 tablet (750 mg total) by mouth at bedtime. (Patient not taking: Reported on 10/11/2020) 30 tablet 0   naproxen (NAPROSYN) 375 MG tablet Take by mouth. (Patient not taking: Reported on 10/11/2020)     triamcinolone cream (KENALOG) 0.1 % Apply topically 2 (two) times daily as needed. (Patient not taking: Reported on 10/11/2020)     No facility-administered medications prior to visit.    ROS See HPI  Objective:  BP 120/74   Pulse 76   Temp 98.6 F (37 C)   Ht 6\' 1"  (1.854 m)   Wt 197 lb 6.4 oz (89.5 kg)   SpO2  98%   BMI 26.04 kg/m   Physical Exam Constitutional:      General: He is not in acute distress. Neurological:     Mental Status: He is alert.  Psychiatric:        Attention and Perception: Attention normal.        Mood and Affect: Mood is anxious.        Speech: Speech normal.        Behavior: Behavior is cooperative.        Thought Content: Thought content normal.        Cognition and Memory: Cognition normal.   Assessment & Plan:  This visit occurred during the SARS-CoV-2 public health emergency.  Safety protocols were in place, including screening questions prior to the  visit, additional usage of staff PPE, and extensive cleaning of exam room while observing appropriate contact time as indicated for disinfecting solutions.   Chrisotpher was seen today for follow-up.  Diagnoses and all orders for this visit:  Severe episode of recurrent major depressive disorder, with psychotic features (HCC) -     Ambulatory referral to Psychiatry -     Ambulatory referral to Psychology -     buPROPion (WELLBUTRIN SR) 100 MG 12 hr tablet; Take 1 tablet (100 mg total) by mouth 2 (two) times daily.  Grief -     buPROPion (WELLBUTRIN SR) 100 MG 12 hr tablet; Take 1 tablet (100 mg total) by mouth 2 (two) times daily.  Problem List Items Addressed This Visit       Other   Depression, recurrent (HCC) - Primary    Chronic, waxing and waning, worse with recent death of grand parent. Denies any suicide ideation or previous attempt. No previous IVC, no ETOH or tobacco or illicit drug use. No FHx of mental health disorder No improvement with fluoxetine and vistaril, medications were discontinued 2weeks ago.  Start wellbutrin Entered referral to psychiatry and psychology. Provided printed information on was to manage depression while remaining active. Will complete form for intermittent FMLA. F/up in 52month       Relevant Medications   buPROPion (WELLBUTRIN SR) 100 MG 12 hr tablet   Other Visit Diagnoses     Grief       Relevant Medications   buPROPion (WELLBUTRIN SR) 100 MG 12 hr tablet       Follow-up: Return in about 6 weeks (around 11/22/2020) for anxiety and depression ( ).  Alysia Penna, NP

## 2020-10-11 NOTE — Telephone Encounter (Signed)
Patient states that he was told to call back with the medications he was taking. He named Fluoxetine 20mg  daily and Hydroxyzine 25mg  as needed. 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 Please call him at [phone] if you have any questions.

## 2020-10-11 NOTE — Assessment & Plan Note (Signed)
Chronic, waxing and waning, worse with recent death of grand parent. Denies any suicide ideation or previous attempt. No previous IVC, no ETOH or tobacco or illicit drug use. No FHx of mental health disorder No improvement with fluoxetine and vistaril, medications were discontinued 2weeks ago.  Start wellbutrin Entered referral to psychiatry and psychology. Provided printed information on was to manage depression while remaining active. Will complete form for intermittent FMLA. F/up in 7month

## 2020-10-12 ENCOUNTER — Ambulatory Visit: Payer: Medicaid Other | Admitting: Nurse Practitioner

## 2020-10-12 NOTE — Telephone Encounter (Signed)
LVM for patient to return call. 

## 2020-10-15 NOTE — Telephone Encounter (Signed)
Pt calling to get clarification on the 4 week Short term disability.  Pt wanted to clarify if he needed to follow up with a visit for  4 weeks or 6 weeks due to medication taking 6 weeks.  Pt explains that he just wanted to make sure the paperwork is accurately filled out as to what he was told he needed to do.  Please advise via Mychart or phone call to be on the same page with each other.

## 2020-10-16 ENCOUNTER — Telehealth: Payer: Self-pay

## 2020-10-16 NOTE — Telephone Encounter (Signed)
Calling today in regards to last message.  I spoke with Ian Spencer about pt's last message and she explained that she will fill out paper work for 4 week FLMA, and to let pt know to f/u in 6 weeks after taking medication prescribed.  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

## 2020-10-23 DIAGNOSIS — G4700 Insomnia, unspecified: Secondary | ICD-10-CM | POA: Insufficient documentation

## 2020-12-03 ENCOUNTER — Encounter: Payer: Self-pay | Admitting: Sports Medicine

## 2020-12-03 ENCOUNTER — Other Ambulatory Visit: Payer: Self-pay

## 2020-12-03 ENCOUNTER — Ambulatory Visit (INDEPENDENT_AMBULATORY_CARE_PROVIDER_SITE_OTHER): Payer: BC Managed Care – PPO | Admitting: Sports Medicine

## 2020-12-03 DIAGNOSIS — L608 Other nail disorders: Secondary | ICD-10-CM

## 2020-12-03 DIAGNOSIS — M79675 Pain in left toe(s): Secondary | ICD-10-CM

## 2020-12-03 MED ORDER — CLINDAMYCIN HCL 300 MG PO CAPS: 300.0000 mg | ORAL_CAPSULE | Freq: Three times a day (TID) | ORAL | 0 refills | Status: DC

## 2020-12-03 NOTE — Progress Notes (Signed)
Subjective: Ian Spencer is a 30 y.o. male patient presents to office today complaining of a moderately painful left great toenail. Reports that he had issues with the nail after playing basketball with the nail increasing more red and swollen and nail was very sore to touch or with pressure. Denies any other pedal complaints.   Patient Active Problem List   Diagnosis Date Noted   Lumbar herniated disc 04/03/2020   Acute left-sided low back pain with left-sided sciatica 04/03/2020   Depression, recurrent (HCC) 02/15/2020    Current Outpatient Medications on File Prior to Visit  Medication Sig Dispense Refill   buPROPion (WELLBUTRIN SR) 100 MG 12 hr tablet Take 1 tablet (100 mg total) by mouth 2 (two) times daily. 60 tablet 5   No current facility-administered medications on file prior to visit.    Allergies  Allergen Reactions   Penicillins Hives and Rash    Childhood allergy, unsure of reaction.    Vancomycin Rash and Hives    Objective:  There were no vitals filed for this visit.  General: Well developed, nourished, in no acute distress, alert and oriented x3   Dermatology: Skin is warm, dry and supple bilateral. Left hallux nail appears to be lifting with 100% hematoma under the nail. (+) Erythema. (+) Edema. (-) serosanguous drainage present. The remaining nails appear unremarkable at this time. There are no open sores, lesions or other signs of infection  present.  Vascular: Dorsalis Pedis artery and Posterior Tibial artery pedal pulses are 2/4 bilateral with immedate capillary fill time. Pedal hair growth present. No lower extremity edema.   Neruologic: Grossly intact via light touch bilateral.  Musculoskeletal: Tenderness to palpation of the left 1st toenail. Muscular strength within normal limits in all groups bilateral.   Assesement and Plan: Problem List Items Addressed This Visit   None Visit Diagnoses     Nail hemorrhage    -  Primary   Toe pain, left            -Discussed treatment alternatives and plan of care; Explained temporary nail avulsion and post procedure course to patient. Patient elects for removal of the left hallux nail - After a verbal and written consent, injected 3 ml of a 50:50 mixture of 2% plain  lidocaine and 0.5% plain marcaine in a normal hallux block fashion. Next, a  betadine prep was performed. Anesthesia was tested and found to be appropriate.  The left hallux nail was then incised from the hyponychium to the epinychium. The nail was removed and cleared from the field. The area was curretted for any remaining nail or spicules. Phenol application performed and the area was then flushed with alcohol and dressed with antibiotic cream and a dry sterile dressing. -Patient was instructed to leave the dressing intact for today and begin soaking  in a weak solution of betadine or Epsom salt and water tomorrow. Patient was instructed to soak for 15-20 minutes each day and apply neosporin and a gauze or bandaid dressing each day. -Patient was instructed to monitor the toe for signs of infection and return to office if toe becomes red, hot or swollen. -Rx Clindamycin for preventative measures  -Advised ice, elevation, and tylenol or motrin if needed for pain.  -Patient is to return PRN or sooner if problems arise.  Asencion Islam, DPM

## 2020-12-03 NOTE — Patient Instructions (Signed)

## 2021-09-02 ENCOUNTER — Ambulatory Visit
Admission: EM | Admit: 2021-09-02 | Discharge: 2021-09-02 | Disposition: A | Discharge status: Home / Self Care | Payer: BC Managed Care – PPO | Attending: Urgent Care | Admitting: Urgent Care

## 2021-09-02 ENCOUNTER — Encounter: Payer: Self-pay | Admitting: Emergency Medicine

## 2021-09-02 DIAGNOSIS — J020 Streptococcal pharyngitis: Secondary | ICD-10-CM

## 2021-09-02 LAB — POCT RAPID STREP A (OFFICE): Rapid Strep A Screen: POSITIVE — AB

## 2021-09-02 MED ORDER — CLINDAMYCIN HCL 300 MG PO CAPS: 300.0000 mg | ORAL_CAPSULE | Freq: Three times a day (TID) | ORAL | 0 refills | Status: DC

## 2021-09-02 NOTE — ED Provider Notes (Signed)
[facility] ? ? ?MRN: [id number] DOB: Apr 07, 1991 ? ?Subjective:  ? ?Ian Spencer is a 31 y.o. male presenting for 3-day history of acute onset throat pain, painful swallowing, sinus congestion.  Patient has had a very difficult time eating or drinking anything.  No sick contacts at home.  He has a remote history of asthma.  He does state that it hurts for him to breathe in his throat. ? ?No current facility-administered medications for this encounter. ? ?Current Outpatient Medications:  ?  buPROPion (WELLBUTRIN SR) 100 MG 12 hr tablet, Take 1 tablet (100 mg total) by mouth 2 (two) times daily., Disp: 60 tablet, Rfl: 5 ?  clindamycin (CLEOCIN) 300 MG capsule, Take 1 capsule (300 mg total) by mouth 3 (three) times daily., Disp: 21 capsule, Rfl: 0  ? ?Allergies  ?Allergen Reactions  ? Penicillins Hives and Rash  ?  Childhood allergy, unsure of reaction.   ? Vancomycin Rash and Hives  ? ? ?Past Medical History:  ?Diagnosis Date  ? Allergy   ? Asthma   ? H/O sprain of knee   ?  ? ?Past Surgical History:  ?Procedure Laterality Date  ? KNEE ARTHROSCOPY WITH ANTERIOR CRUCIATE LIGAMENT (ACL) REPAIR Right 2010  ? ? ?Family History  ?Problem Relation Age of Onset  ? Cancer Maternal Grandfather 31  ?     colon cancer  ? ? ?Social History  ? ?Tobacco Use  ? Smoking status: Never  ? Smokeless tobacco: Never  ?Vaping Use  ? Vaping Use: Never used  ?Substance Use Topics  ? Alcohol use: Yes  ?  Comment: 3x year  ? Drug use: Never  ? ? ?ROS ? ? ?Objective:  ? ?Vitals: ?BP 139/70   Pulse (!) 101   Temp 99.6 ?F (37.6 ?C)   Resp 20   SpO2 98%  ? ?Physical Exam ?Constitutional:   ?   General: He is not in acute distress. ?   Appearance: Normal appearance. He is well-developed and normal weight. He is not ill-appearing, toxic-appearing or diaphoretic.  ?HENT:  ?   Head: Normocephalic and atraumatic.  ?   Right Ear: Tympanic membrane, ear canal and external ear normal. No drainage, swelling or tenderness. No  middle ear effusion. There is no impacted cerumen. Tympanic membrane is not erythematous.  ?   Left Ear: Tympanic membrane, ear canal and external ear normal. No drainage, swelling or tenderness.  No middle ear effusion. There is no impacted cerumen. Tympanic membrane is not erythematous.  ?   Nose: Congestion present. No rhinorrhea.  ?   Mouth/Throat:  ?   Mouth: Mucous membranes are moist.  ?   Pharynx: Pharyngeal swelling and posterior oropharyngeal erythema present. No oropharyngeal exudate or uvula swelling.  ?   Tonsils: No tonsillar exudate or tonsillar abscesses. 0 on the right. 0 on the left.  ?Eyes:  ?   General: No scleral icterus.    ?   Right eye: No discharge.     ?   Left eye: No discharge.  ?   Extraocular Movements: Extraocular movements intact.  ?   Conjunctiva/sclera: Conjunctivae normal.  ?Cardiovascular:  ?   Rate and Rhythm: Normal rate and regular rhythm.  ?   Heart sounds: Normal heart sounds. No murmur heard. ?  No friction rub. No gallop.  ?Pulmonary:  ?   Effort: Pulmonary effort is normal. No respiratory distress.  ?   Breath sounds: Normal breath sounds. No stridor. No wheezing,  rhonchi or rales.  ?Musculoskeletal:  ?   Cervical back: Normal range of motion and neck supple. No rigidity. No muscular tenderness.  ?Neurological:  ?   General: No focal deficit present.  ?   Mental Status: He is alert and oriented to person, place, and time.  ?Psychiatric:     ?   Mood and Affect: Mood normal.     ?   Behavior: Behavior normal.     ?   Thought Content: Thought content normal.     ?   Judgment: Judgment normal.  ? ?Results for orders placed or performed during the hospital encounter of 09/02/21 (from the past 24 hour(s))  ?POCT rapid strep A     Status: Abnormal  ? Collection Time: 09/02/21  1:13 PM  ?Result Value Ref Range  ? Rapid Strep A Screen Positive (A) Negative  ? ? ?Assessment and Plan :  ? ?PDMP not reviewed this encounter. ? ?1. Strep pharyngitis   ? ?Unfortunately, patient is  allergic to penicillins and therefore have to use oral antibiotics with clindamycin.  Recommended supportive care. Counseled patient on potential for adverse effects with medications prescribed/recommended today, ER and return-to-clinic precautions discussed, patient verbalized understanding. ? ?  ?Wallis Bamberg, PA-C ?09/02/21 1316 ? ?

## 2021-09-02 NOTE — ED Triage Notes (Signed)
Pt here with 3 days hx of sore throat and congestion. Pt says it is hard to eat and drink.  ?

## 2022-04-24 ENCOUNTER — Ambulatory Visit
Admission: EM | Admit: 2022-04-24 | Discharge: 2022-04-24 | Disposition: A | Discharge status: Home / Self Care | Payer: BC Managed Care – PPO | Attending: Internal Medicine | Admitting: Internal Medicine

## 2022-04-24 DIAGNOSIS — J020 Streptococcal pharyngitis: Secondary | ICD-10-CM | POA: Diagnosis not present

## 2022-04-24 LAB — POCT RAPID STREP A (OFFICE): Rapid Strep A Screen: POSITIVE — AB

## 2022-04-24 MED ORDER — NAPROXEN 500 MG PO TABS: 500.0000 mg | ORAL_TABLET | Freq: Two times a day (BID) | ORAL | 0 refills | Status: DC

## 2022-04-24 MED ORDER — CLINDAMYCIN HCL 300 MG PO CAPS: 300.0000 mg | ORAL_CAPSULE | Freq: Three times a day (TID) | ORAL | 0 refills | Status: DC

## 2022-04-24 NOTE — ED Provider Notes (Signed)
[facility]  Note:  This document was prepared using Systems analyst and may include unintentional dictation errors.  MRN: [id number] DOB: 1990/07/30  Subjective:   Ian Spencer is a 31 y.o. male presenting for 2-3 day history of throat pain, painful swallowing, fatigue, sinus congestion, body aches. Has a remote history of asthma, hasn't needed an inhaler in years. No smoking, vaping, marijuana use. Many sick contacts as he works at an [employer].   No current facility-administered medications for this encounter.  Current Outpatient Medications:    buPROPion (WELLBUTRIN SR) 100 MG 12 hr tablet, Take 1 tablet (100 mg total) by mouth 2 (two) times daily., Disp: 60 tablet, Rfl: 5   clindamycin (CLEOCIN) 300 MG capsule, Take 1 capsule (300 mg total) by mouth 3 (three) times daily., Disp: 30 capsule, Rfl: 0   Allergies  Allergen Reactions   Penicillins Hives and Rash    Childhood allergy, unsure of reaction.    Vancomycin Rash and Hives    Past Medical History:  Diagnosis Date   Allergy    Asthma    H/O sprain of knee      Past Surgical History:  Procedure Laterality Date   KNEE ARTHROSCOPY WITH ANTERIOR CRUCIATE LIGAMENT (ACL) REPAIR Right 2010    Family History  Problem Relation Age of Onset   Cancer Maternal Grandfather 52       colon cancer    Social History   Tobacco Use   Smoking status: Never   Smokeless tobacco: Never  Vaping Use   Vaping Use: Never used  Substance Use Topics   Alcohol use: Not Currently   Drug use: Never    ROS   Objective:   Vitals: BP 126/78   Pulse 85   Temp 99.4 F (37.4 C) (Oral)   Resp 18   SpO2 96%   Physical Exam Constitutional:      General: He is not in acute distress.    Appearance: Normal appearance. He is well-developed and normal weight. He is not ill-appearing, toxic-appearing or diaphoretic.  HENT:     Head: Normocephalic and atraumatic.     Right Ear: Tympanic membrane, ear  canal and external ear normal. No drainage, swelling or tenderness. No middle ear effusion. There is no impacted cerumen. Tympanic membrane is not erythematous or bulging.     Left Ear: Tympanic membrane, ear canal and external ear normal. No drainage, swelling or tenderness.  No middle ear effusion. There is no impacted cerumen. Tympanic membrane is not erythematous or bulging.     Nose: Nose normal. No congestion or rhinorrhea.     Mouth/Throat:     Mouth: Mucous membranes are moist.     Pharynx: Pharyngeal swelling, oropharyngeal exudate and posterior oropharyngeal erythema present. No uvula swelling.     Tonsils: Tonsillar exudate present. No tonsillar abscesses. 1+ on the right. 1+ on the left.  Eyes:     General: No scleral icterus.       Right eye: No discharge.        Left eye: No discharge.     Extraocular Movements: Extraocular movements intact.     Conjunctiva/sclera: Conjunctivae normal.  Cardiovascular:     Rate and Rhythm: Normal rate and regular rhythm.     Heart sounds: Normal heart sounds. No murmur heard.    No friction rub. No gallop.  Pulmonary:     Effort: Pulmonary effort is normal. No respiratory distress.     Breath sounds:  Normal breath sounds. No stridor. No wheezing, rhonchi or rales.  Musculoskeletal:     Cervical back: Normal range of motion and neck supple. No rigidity. No muscular tenderness.  Neurological:     General: No focal deficit present.     Mental Status: He is alert and oriented to person, place, and time.  Psychiatric:        Mood and Affect: Mood normal.        Behavior: Behavior normal.        Thought Content: Thought content normal.    Results for orders placed or performed during the hospital encounter of 04/24/22 (from the past 24 hour(s))  POCT rapid strep A     Status: Abnormal   Collection Time: 04/24/22  5:45 PM  Result Value Ref Range   Rapid Strep A Screen Positive (A) Negative    Assessment and Plan :   PDMP not reviewed  this encounter.  1. Strep pharyngitis     Will treat for strep pharyngitis.  Patient is to start clindamycin, use supportive care otherwise. Counseled patient on potential for adverse effects with medications prescribed/recommended today, ER and return-to-clinic precautions discussed, patient verbalized understanding.    Wallis Bamberg, [place] 04/24/22 1749

## 2022-04-24 NOTE — ED Triage Notes (Signed)
Pt c/o sore throat, nasal congestion, fatigue, body aches x 2-3 days-NAD-steady gait

## 2022-08-01 ENCOUNTER — Encounter: Payer: Self-pay | Admitting: Nurse Practitioner

## 2022-08-01 ENCOUNTER — Telehealth: Payer: Self-pay | Admitting: Nurse Practitioner

## 2022-08-01 ENCOUNTER — Ambulatory Visit: Payer: BC Managed Care – PPO | Admitting: Nurse Practitioner

## 2022-08-01 VITALS — BP 102/62 | HR 73 | Temp 98.9°F | Resp 16 | Ht 73.0 in | Wt 205.0 lb

## 2022-08-01 DIAGNOSIS — F332 Major depressive disorder, recurrent severe without psychotic features: Secondary | ICD-10-CM

## 2022-08-01 DIAGNOSIS — F411 Generalized anxiety disorder: Secondary | ICD-10-CM | POA: Diagnosis not present

## 2022-08-01 DIAGNOSIS — E559 Vitamin D deficiency, unspecified: Secondary | ICD-10-CM | POA: Diagnosis not present

## 2022-08-01 DIAGNOSIS — F339 Major depressive disorder, recurrent, unspecified: Secondary | ICD-10-CM | POA: Insufficient documentation

## 2022-08-01 LAB — COMPREHENSIVE METABOLIC PANEL
ALT: 19 U/L (ref 0–53)
AST: 19 U/L (ref 0–37)
Albumin: 4.5 g/dL (ref 3.5–5.2)
Alkaline Phosphatase: 59 U/L (ref 39–117)
BUN: 12 mg/dL (ref 6–23)
CO2: 29 mEq/L (ref 19–32)
Calcium: 9.7 mg/dL (ref 8.4–10.5)
Chloride: 103 mEq/L (ref 96–112)
Creatinine, Ser: 1.15 mg/dL (ref 0.40–1.50)
GFR: 84.46 mL/min (ref >60.00)
Glucose, Bld: 82 mg/dL (ref 70–99)
Potassium: 3.8 mEq/L (ref 3.5–5.1)
Sodium: 138 mEq/L (ref 135–145)
Total Bilirubin: 1 mg/dL (ref 0.2–1.2)
Total Protein: 7.8 g/dL (ref 6.0–8.3)

## 2022-08-01 LAB — TSH: TSH: 1.82 u[IU]/mL (ref 0.35–5.50)

## 2022-08-01 LAB — VITAMIN D 25 HYDROXY (VIT D DEFICIENCY, FRACTURES): VITD: 11.51 ng/mL — ABNORMAL LOW (ref 30.00–100.00)

## 2022-08-01 MED ORDER — FLUOXETINE HCL 20 MG PO TABS
20.0000 mg | ORAL_TABLET | Freq: Every day | ORAL | 5 refills | Status: AC
Start: 2022-08-01 — End: ?

## 2022-08-01 NOTE — Patient Instructions (Addendum)
Go to lab  [email or web address]  [facility], [address], [place], [place]   [phone] [facility], [address], [place], [place] [phone] [facility] [phone] [facility], [address], [place], [place] [phone] [facility] 336 [phone] [phone] [facility] 336 [phone] [facility] [phone]   Start 1/2 tablet once daily for 1 week and then increase to a full tablet once daily continuously  We discussed common side effects such as nausea, drowsiness and weight gain.  Also discussed rare but serious side effect of suicide ideation.  She is instructed to discontinue medication go directly to ED if this occurs.  Pt verbalizes understanding.    Get help right away if you feel like you may hurt yourself or others, or have thoughts about taking your own life. Go to your nearest emergency room or: Call 911. Call the National Suicide Prevention Lifeline at 541 376 20431-(304) 127-6773 or 988. This is open 24 hours a day. Text the Crisis Text Line at (860)405-6565741741.  Managing Depression, Adult Depression is a mental health condition that affects your thoughts, feelings, and actions. Being diagnosed with depression can bring you relief if you did not know why you have felt or behaved a certain way. It could also leave you feeling overwhelmed. Finding ways to manage your symptoms can help you feel more positive about your future. How to manage lifestyle changes Being depressed is difficult. Depression can increase the level of everyday stress. Stress can make depression symptoms worse. You may believe your symptoms cannot be managed or will never improve. However, there are many things you can try to help manage your symptoms. There is hope. Managing stress  Stress is your body's reaction to life changes and events, both good and bad. Stress can add to your feelings  of depression. Learning to manage your stress can help lessen your feelings of depression. Try some of the following approaches to reducing your stress (stress reduction techniques): Listen to music that you enjoy and that inspires you. Try using a meditation app or take a meditation class. Develop a practice that helps you connect with your spiritual self. Walk in nature, pray, or go to a place of worship. Practice deep breathing. To do this, inhale slowly through your nose. Pause at the top of your inhale for a few seconds and then exhale slowly, letting yourself relax. Repeat this three or four times. Practice yoga to help relax and work your muscles. Choose a stress reduction technique that works for you. These techniques take time and practice to develop. Set aside 5-15 minutes a day to do them. Therapists can offer training in these techniques. Do these things to help manage stress: Keep a journal. Know your limits. Set healthy boundaries for yourself and others, such as saying "no" when you think something is too much. Pay attention to how you react to certain situations. You may not be able to control everything, but you can change your reaction. Add humor to your life by watching funny movies or shows. Make time for activities that you enjoy and that relax you. Spend less time using electronics, especially at night before bed. The light from screens can make your brain think it is time to get up rather than go to bed.  Medicines Medicines, such as antidepressants, are often a part of treatment for depression. Talk with your pharmacist or health care provider about all the medicines, supplements, and  herbal products that you take, their possible side effects, and what medicines and other products are safe to take together. Make sure to report any side effects you may have to your health care provider. Relationships Your health care provider may suggest family therapy, couples therapy, or  individual therapy as part of your treatment. How to recognize changes Everyone responds differently to treatment for depression. As you recover from depression, you may start to: Have more interest in doing activities. Feel more hopeful. Have more energy. Eat a more regular amount of food. Have better mental focus. It is important to recognize if your depression is not getting better or is getting worse. The symptoms you had in the beginning may return, such as: Feeling tired. Eating too much or too little. Sleeping too much or too little. Feeling restless, agitated, or hopeless. Trouble focusing or making decisions. Having unexplained aches and pains. Feeling irritable, angry, or aggressive. If you or your family members notice these symptoms coming back, let your health care provider know right away. Follow these instructions at home: Activity Try to get some form of exercise each day, such as walking. Try yoga, mindfulness, or other stress reduction techniques. Participate in group activities if you are able. Lifestyle Get enough sleep. Cut down on or stop using caffeine, tobacco, alcohol, and any other harmful substances. Eat a healthy diet that includes plenty of vegetables, fruits, whole grains, low-fat dairy products, and lean protein. Limit foods that are high in solid fats, added sugar, or salt (sodium). General instructions Take over-the-counter and prescription medicines only as told by your health care provider. Keep all follow-up visits. It is important for your health care provider to check on your mood, behavior, and medicines. Your health care provider may need to make changes to your treatment. Where to find support Talking to others  Friends and family members can be sources of support and guidance. Talk to trusted friends or family members about your condition. Explain your symptoms and let them know that you are working with a health care provider to treat your  depression. Tell friends and family how they can help. Finances Find mental health providers that fit with your financial situation. Talk with your health care provider if you are worried about access to food, housing, or medicine. Call your insurance company to learn about your co-pays and prescription plan. Where to find more information You can find support in your area from: Anxiety and Depression Association of America (ADAA): adaa.org Mental Health America: mentalhealthamerica.net The [employer] American on Mental Illness: nami.org Contact a health care provider if: You stop taking your antidepressant medicines, and you have any of these symptoms: Nausea. Headache. Light-headedness. Chills and body aches. Not being able to sleep (insomnia). You or your friends and family think your depression is getting worse. Get help right away if: You have thoughts of hurting yourself or others. Get help right away if you feel like you may hurt yourself or others, or have thoughts about taking your own life. Go to your nearest emergency room or: Call 911. Call the National Suicide Prevention Lifeline at 986 174 1910 or 988. This is open 24 hours a day. Text the Crisis Text Line at 509-207-1016. This information is not intended to replace advice given to you by your health care provider. Make sure you discuss any questions you have with your health care provider. Document Revised: 08/20/2021 Document Reviewed: 08/20/2021 Elsevier Patient Education  2023 [employer].

## 2022-08-01 NOTE — Telephone Encounter (Signed)
Sent message via mychart

## 2022-08-01 NOTE — Progress Notes (Signed)
Established Patient Visit  Patient: Ian Spencer   DOB: 1991/02/06   32 y.o. Male  MRN: [id number] Visit Date: 08/01/2022  Subjective:    Chief Complaint  Patient presents with   Altered Mental Status    Pt states"  not feeling like himself.  He's losing weight, decreased appetite and insomnia.  He has been having panic attacks as well.    HPI Depression, recurrent Chronic, onset 31yrs ago,  Reports difficulty with appetite, irritability, lack of motivation, lost of interest, restlessness, insomnia Has difficulty with responsibility at home and at work Denies any SI/HI/hallucination No previous IVC He was previously prescribed Wellbutrin and Seroquel. He did not take meds due to fear of possible side effects. Reports he attended talk therapy every other week x 1year but stopped 2weeks ago due to schedule conflict. He is unable to remember name of therapist.  Advised about need for medication in combination to psychotherapy. Advised about side effects of fluoxetine. Provided list of other local counseling services. Check cmp and tsh He verbalized understanding. Provided ED precautions Sent fluoxetine F/up in 54month   BP Readings from Last 3 Encounters:  08/01/22 102/62  04/24/22 126/78  09/02/21 139/70    Wt Readings from Last 3 Encounters:  08/01/22 205 lb (93 kg)  10/11/20 197 lb 6.4 oz (89.5 kg)  04/11/20 204 lb 2.3 oz (92.6 kg)       08/01/2022   11:51 AM 10/11/2020    9:29 AM 02/15/2020   12:37 PM  Depression screen PHQ 2/9  Decreased Interest 3 3 3   Down, Depressed, Hopeless 3 3 3   PHQ - 2 Score 6 6 6   Altered sleeping 3 3 3   Tired, decreased energy 3 3 3   Change in appetite 2 3 3   Feeling bad or failure about yourself  3 2   Trouble concentrating 3 3 3   Moving slowly or fidgety/restless 2 3 3   Suicidal thoughts 0 1 2  PHQ-9 Score 22 24 23   Difficult doing work/chores Extremely dIfficult Extremely dIfficult Extremely dIfficult       08/01/2022    11:51 AM 10/11/2020    9:30 AM 02/15/2020   12:38 PM  GAD 7 : Generalized Anxiety Score  Nervous, Anxious, on Edge 3 2 3   Control/stop worrying 3 2 3   Worry too much - different things 3 2 3   Trouble relaxing 3 3 3   Restless 2 3 3   Easily annoyed or irritable 3 3 3   Afraid - awful might happen 1 2 3   Total GAD 7 Score 18 17 21   Anxiety Difficulty Extremely difficult Extremely difficult Extremely difficult   Reviewed medical, surgical, and social history today Social History   Socioeconomic History   Marital status: Married    Spouse name: Not on file   Number of children: Not on file   Years of education: Not on file   Highest education level: Associate degree: academic program  Occupational History   Not on file  Tobacco Use   Smoking status: Never   Smokeless tobacco: Never  Vaping Use   Vaping Use: Never used  Substance and Sexual Activity   Alcohol use: Not Currently   Drug use: Never   Sexual activity: Yes    Birth control/protection: None  Other Topics Concern   Not on file  Social History Narrative   Not on file   Social Determinants of Health   Financial  Resource Strain: High Risk (07/31/2022)   Overall Financial Resource Strain (CARDIA)    Difficulty of Paying Living Expenses: Very hard  Food Insecurity: Food Insecurity Present (07/31/2022)   Hunger Vital Sign    Worried About Running Out of Food in the Last Year: Often true    Ran Out of Food in the Last Year: Sometimes true  Transportation Needs: No Transportation Needs (07/31/2022)   PRAPARE - Administrator, Civil ServiceTransportation    Lack of Transportation (Medical): No    Lack of Transportation (Non-Medical): No  Physical Activity: Insufficiently Active (07/31/2022)   Exercise Vital Sign    Days of Exercise per Week: 1 day    Minutes of Exercise per Session: 20 min  Stress: Stress Concern Present (07/31/2022)   [employer] - Occupational Stress Questionnaire    Feeling of Stress : Very much  Social  Connections: Moderately Isolated (07/31/2022)   Social Connection and Isolation Panel [NHANES]    Frequency of Communication with Friends and Family: Never    Frequency of Social Gatherings with Friends and Family: Never    Attends Religious Services: More than 4 times per year    Active Member of [employer] or Organizations: No    Attends Engineer, structuralClub or Organization Meetings: Not on file    Marital Status: Married  Catering managerntimate Partner Violence: Not on file    Medications: Outpatient Medications Prior to Visit  Medication Sig   [DISCONTINUED] buPROPion (WELLBUTRIN SR) 100 MG 12 hr tablet Take 1 tablet (100 mg total) by mouth 2 (two) times daily.   [DISCONTINUED] naproxen (NAPROSYN) 500 MG tablet Take 1 tablet (500 mg total) by mouth 2 (two) times daily with a meal.   [DISCONTINUED] clindamycin (CLEOCIN) 300 MG capsule Take 1 capsule (300 mg total) by mouth 3 (three) times daily.   No facility-administered medications prior to visit.   Reviewed past medical and social history.   ROS per HPI above  Last CBC Lab Results  Component Value Date   WBC 6.7 04/12/2020   HGB 15.0 04/12/2020   HCT 45.7 04/12/2020   MCV 87.5 04/12/2020   MCH 28.7 04/12/2020   RDW 12.6 04/12/2020   PLT 190 04/12/2020   Last metabolic panel Lab Results  Component Value Date   GLUCOSE 105 (H) 04/12/2020   NA 136 04/12/2020   K 4.0 04/12/2020   CL 100 04/12/2020   CO2 27 04/12/2020   BUN 13 04/12/2020   CREATININE 1.11 04/12/2020   GFRNONAA >60 04/12/2020   CALCIUM 9.8 04/12/2020   PROT 7.9 03/15/2020   ALBUMIN 4.2 03/15/2020   BILITOT 1.6 (H) 03/15/2020   ALKPHOS 49 03/15/2020   AST 30 03/15/2020   ALT 32 03/15/2020   ANIONGAP 9 04/12/2020   Last thyroid functions Lab Results  Component Value Date   TSH 1.82 02/16/2020      Objective:  BP 102/62 (BP Location: Right Arm, Patient Position: Sitting, Cuff Size: Large)   Pulse 73   Temp 98.9 F (37.2 C) (Temporal)   Resp 16   Ht 6\' 1"  (1.854 m)   Wt 205  lb (93 kg)   SpO2 99%   BMI 27.05 kg/m      Physical Exam Psychiatric:        Attention and Perception: Attention normal.        Mood and Affect: Mood is anxious.        Speech: Speech normal.        Behavior: Behavior is cooperative.  Thought Content: Thought content normal.        Cognition and Memory: Cognition normal.     No results found for any visits on 08/01/22.    Assessment & Plan:    Problem List Items Addressed This Visit       Other   Depression, recurrent - Primary    Chronic, onset 9876yrs ago,  Reports difficulty with appetite, irritability, lack of motivation, lost of interest, restlessness, insomnia Has difficulty with responsibility at home and at work Denies any SI/HI/hallucination No previous IVC He was previously prescribed Wellbutrin and Seroquel. He did not take meds due to fear of possible side effects. Reports he attended talk therapy every other week x 1year but stopped 2weeks ago due to schedule conflict. He is unable to remember name of therapist.  Advised about need for medication in combination to psychotherapy. Advised about side effects of fluoxetine. Provided list of other local counseling services. Check cmp and tsh He verbalized understanding. Provided ED precautions Sent fluoxetine F/up in 55month        Relevant Medications   FLUoxetine (PROZAC) 20 MG tablet   Generalized anxiety disorder   Relevant Medications   FLUoxetine (PROZAC) 20 MG tablet   Other Relevant Orders   Comprehensive metabolic panel   TSH   Vitamin D deficiency   Relevant Orders   VITAMIN D 25 Hydroxy (Vit-D Deficiency, Fractures)   Return in about 4 weeks (around 08/29/2022) for depression and anxiety.     Alysia Pennaharlotte Damere Brandenburg, NP

## 2022-08-01 NOTE — Telephone Encounter (Signed)
Caller Name: Garett Call back phone #: [phone]  Reason for Call: Pt called asking if Claris Gower is willing to fill out his FMLA papers? He wants a call for confirmation that it can be done.

## 2022-08-01 NOTE — Telephone Encounter (Signed)
Pt has chosen to remove [place] as his pcp. I have on 08/01/2022

## 2022-08-01 NOTE — Assessment & Plan Note (Addendum)
Chronic, onset 16yrs ago,  Reports difficulty with appetite, irritability, lack of motivation, lost of interest, restlessness, insomnia Has difficulty with responsibility at home and at work Denies any SI/HI/hallucination No previous IVC He was previously prescribed Wellbutrin and Seroquel. He did not take meds due to fear of possible side effects. Reports he attended talk therapy every other week x 1year but stopped 2weeks ago due to schedule conflict. He is unable to remember name of therapist.  Advised about need for medication in combination to psychotherapy. Advised about side effects of fluoxetine. Provided list of other local counseling services. Check cmp and tsh He verbalized understanding. Provided ED precautions Sent fluoxetine F/up in 95month

## 2022-08-04 ENCOUNTER — Encounter: Payer: Self-pay | Admitting: Nurse Practitioner

## 2022-08-04 MED ORDER — VITAMIN D (ERGOCALCIFEROL) 1.25 MG (50000 UNIT) PO CAPS
50000.0000 [IU] | ORAL_CAPSULE | ORAL | 0 refills | Status: AC
Start: 2022-08-04 — End: ?

## 2022-08-04 NOTE — Telephone Encounter (Signed)
Removed you as PCP and sent letter via mychart to notify pt you were removed at his request.

## 2022-08-04 NOTE — Progress Notes (Signed)
Normal CMP and TSH Low vit.D: start 50000IU weekly x 12weeks, then switch to 1000IU daily OTC dose

## 2022-08-04 NOTE — Addendum Note (Signed)
Addended by: Alysia Penna L on: 08/04/2022 10:31 AM   Modules accepted: Orders

## 2022-08-29 ENCOUNTER — Other Ambulatory Visit: Payer: Self-pay | Admitting: Nurse Practitioner

## 2022-08-29 DIAGNOSIS — F332 Major depressive disorder, recurrent severe without psychotic features: Secondary | ICD-10-CM

## 2022-08-29 DIAGNOSIS — F411 Generalized anxiety disorder: Secondary | ICD-10-CM
